# Patient Record
Sex: Female | Born: 1955 | Race: White | Hispanic: No | Marital: Married | Smoking: Never smoker
Health system: Southern US, Community
[De-identification: ages and names within clinical notes are randomized; demographics above are authoritative.]

## PROBLEM LIST (undated history)

## (undated) DIAGNOSIS — I959 Hypotension, unspecified: Secondary | ICD-10-CM

## (undated) DIAGNOSIS — J329 Chronic sinusitis, unspecified: Secondary | ICD-10-CM

## (undated) DIAGNOSIS — H9190 Unspecified hearing loss, unspecified ear: Secondary | ICD-10-CM

## (undated) DIAGNOSIS — K589 Irritable bowel syndrome without diarrhea: Secondary | ICD-10-CM

## (undated) DIAGNOSIS — C4491 Basal cell carcinoma of skin, unspecified: Secondary | ICD-10-CM

## (undated) DIAGNOSIS — L719 Rosacea, unspecified: Secondary | ICD-10-CM

## (undated) HISTORY — DX: Rosacea, unspecified: L71.9

## (undated) HISTORY — DX: Irritable bowel syndrome, unspecified: K58.9

## (undated) HISTORY — DX: Chronic sinusitis, unspecified: J32.9

## (undated) HISTORY — DX: Hypotension, unspecified: I95.9

## (undated) HISTORY — PX: BREAST EXCISIONAL BIOPSY: SUR124

## (undated) HISTORY — PX: AUGMENTATION MAMMAPLASTY: SUR837

## (undated) HISTORY — DX: Basal cell carcinoma of skin, unspecified: C44.91

## (undated) HISTORY — DX: Unspecified hearing loss, unspecified ear: H91.90

---

## 1998-03-09 ENCOUNTER — Ambulatory Visit (HOSPITAL_COMMUNITY): Admission: RE | Admit: 1998-03-09 | Discharge: 1998-03-09 | Payer: Self-pay | Admitting: Gynecology

## 1998-04-05 ENCOUNTER — Ambulatory Visit (HOSPITAL_BASED_OUTPATIENT_CLINIC_OR_DEPARTMENT_OTHER): Admission: RE | Admit: 1998-04-05 | Discharge: 1998-04-05 | Payer: Self-pay | Admitting: Surgery

## 1999-03-09 ENCOUNTER — Other Ambulatory Visit: Admission: RE | Admit: 1999-03-09 | Discharge: 1999-03-09 | Payer: Self-pay | Admitting: Gynecology

## 1999-09-26 ENCOUNTER — Encounter: Admission: RE | Admit: 1999-09-26 | Discharge: 1999-09-26 | Payer: Self-pay | Admitting: Gynecology

## 1999-09-26 ENCOUNTER — Encounter: Payer: Self-pay | Admitting: Gynecology

## 2000-05-19 ENCOUNTER — Encounter: Admission: RE | Admit: 2000-05-19 | Discharge: 2000-05-19 | Payer: Self-pay | Admitting: Gynecology

## 2000-05-19 ENCOUNTER — Encounter: Payer: Self-pay | Admitting: Gynecology

## 2000-05-21 ENCOUNTER — Other Ambulatory Visit: Admission: RE | Admit: 2000-05-21 | Discharge: 2000-05-21 | Payer: Self-pay | Admitting: Gynecology

## 2001-06-10 ENCOUNTER — Other Ambulatory Visit: Admission: RE | Admit: 2001-06-10 | Discharge: 2001-06-10 | Payer: Self-pay | Admitting: Gynecology

## 2001-06-11 ENCOUNTER — Encounter: Payer: Self-pay | Admitting: Gynecology

## 2001-06-11 ENCOUNTER — Encounter: Admission: RE | Admit: 2001-06-11 | Discharge: 2001-06-11 | Payer: Self-pay | Admitting: Gynecology

## 2002-09-28 ENCOUNTER — Encounter: Admission: RE | Admit: 2002-09-28 | Discharge: 2002-09-28 | Payer: Self-pay | Admitting: Gynecology

## 2002-09-28 ENCOUNTER — Encounter: Payer: Self-pay | Admitting: Gynecology

## 2003-06-13 ENCOUNTER — Other Ambulatory Visit: Admission: RE | Admit: 2003-06-13 | Discharge: 2003-06-13 | Payer: Self-pay | Admitting: Gynecology

## 2004-06-27 ENCOUNTER — Encounter: Admission: RE | Admit: 2004-06-27 | Discharge: 2004-06-27 | Payer: Self-pay | Admitting: Gynecology

## 2004-07-04 ENCOUNTER — Encounter: Admission: RE | Admit: 2004-07-04 | Discharge: 2004-07-04 | Payer: Self-pay | Admitting: Gynecology

## 2004-08-13 ENCOUNTER — Other Ambulatory Visit: Admission: RE | Admit: 2004-08-13 | Discharge: 2004-08-13 | Payer: Self-pay | Admitting: Gynecology

## 2005-01-04 ENCOUNTER — Encounter: Admission: RE | Admit: 2005-01-04 | Discharge: 2005-01-04 | Payer: Self-pay | Admitting: Gynecology

## 2005-10-22 ENCOUNTER — Other Ambulatory Visit: Admission: RE | Admit: 2005-10-22 | Discharge: 2005-10-22 | Payer: Self-pay | Admitting: Gynecology

## 2006-01-22 ENCOUNTER — Encounter: Admission: RE | Admit: 2006-01-22 | Discharge: 2006-01-22 | Payer: Self-pay | Admitting: Gynecology

## 2006-12-17 ENCOUNTER — Other Ambulatory Visit: Admission: RE | Admit: 2006-12-17 | Discharge: 2006-12-17 | Payer: Self-pay | Admitting: Gynecology

## 2007-05-12 ENCOUNTER — Encounter: Admission: RE | Admit: 2007-05-12 | Discharge: 2007-05-12 | Payer: Self-pay | Admitting: Gynecology

## 2008-05-13 ENCOUNTER — Encounter: Admission: RE | Admit: 2008-05-13 | Discharge: 2008-05-13 | Payer: Self-pay | Admitting: Gynecology

## 2008-05-23 ENCOUNTER — Encounter: Admission: RE | Admit: 2008-05-23 | Discharge: 2008-05-23 | Payer: Self-pay | Admitting: Gynecology

## 2009-07-03 ENCOUNTER — Encounter: Admission: RE | Admit: 2009-07-03 | Discharge: 2009-07-03 | Payer: Self-pay | Admitting: Gynecology

## 2009-07-12 ENCOUNTER — Encounter: Admission: RE | Admit: 2009-07-12 | Discharge: 2009-07-12 | Payer: Self-pay | Admitting: Gynecology

## 2010-08-26 ENCOUNTER — Encounter: Payer: Self-pay | Admitting: Gynecology

## 2011-03-27 ENCOUNTER — Other Ambulatory Visit: Payer: Self-pay | Admitting: Gynecology

## 2011-03-27 DIAGNOSIS — Z1231 Encounter for screening mammogram for malignant neoplasm of breast: Secondary | ICD-10-CM

## 2011-04-24 ENCOUNTER — Ambulatory Visit: Payer: Self-pay

## 2011-05-01 ENCOUNTER — Ambulatory Visit
Admission: RE | Admit: 2011-05-01 | Discharge: 2011-05-01 | Disposition: A | Discharge status: Home / Self Care | Payer: 59 | Source: Ambulatory Visit | Attending: Gynecology | Admitting: Gynecology

## 2011-05-01 DIAGNOSIS — Z1231 Encounter for screening mammogram for malignant neoplasm of breast: Secondary | ICD-10-CM

## 2011-07-05 ENCOUNTER — Encounter (HOSPITAL_BASED_OUTPATIENT_CLINIC_OR_DEPARTMENT_OTHER): Admission: RE | Payer: Self-pay | Source: Ambulatory Visit

## 2011-07-05 ENCOUNTER — Ambulatory Visit (HOSPITAL_BASED_OUTPATIENT_CLINIC_OR_DEPARTMENT_OTHER): Admission: RE | Admit: 2011-07-05 | Payer: 59 | Source: Ambulatory Visit | Admitting: Gynecology

## 2011-07-05 SURGERY — HYSTEROSCOPY
Anesthesia: Monitor Anesthesia Care

## 2012-05-18 ENCOUNTER — Other Ambulatory Visit: Payer: Self-pay | Admitting: Gynecology

## 2012-05-18 DIAGNOSIS — Z1231 Encounter for screening mammogram for malignant neoplasm of breast: Secondary | ICD-10-CM

## 2012-06-10 ENCOUNTER — Ambulatory Visit
Admission: RE | Admit: 2012-06-10 | Discharge: 2012-06-10 | Disposition: A | Discharge status: Home / Self Care | Payer: 59 | Source: Ambulatory Visit | Attending: Gynecology | Admitting: Gynecology

## 2012-06-10 DIAGNOSIS — Z1231 Encounter for screening mammogram for malignant neoplasm of breast: Secondary | ICD-10-CM

## 2013-12-07 ENCOUNTER — Other Ambulatory Visit: Payer: Self-pay

## 2013-12-07 DIAGNOSIS — Z1231 Encounter for screening mammogram for malignant neoplasm of breast: Secondary | ICD-10-CM

## 2013-12-15 ENCOUNTER — Ambulatory Visit
Admission: RE | Admit: 2013-12-15 | Discharge: 2013-12-15 | Disposition: A | Discharge status: Home / Self Care | Payer: 59 | Source: Ambulatory Visit

## 2013-12-15 DIAGNOSIS — Z1231 Encounter for screening mammogram for malignant neoplasm of breast: Secondary | ICD-10-CM

## 2014-07-17 ENCOUNTER — Ambulatory Visit (INDEPENDENT_AMBULATORY_CARE_PROVIDER_SITE_OTHER): Payer: 59

## 2014-08-15 ENCOUNTER — Encounter: Payer: Self-pay | Admitting: Podiatry

## 2014-08-15 ENCOUNTER — Ambulatory Visit (INDEPENDENT_AMBULATORY_CARE_PROVIDER_SITE_OTHER): Payer: 59 | Admitting: Podiatry

## 2014-08-15 VITALS — BP 138/70 | HR 61 | Resp 16

## 2014-08-15 DIAGNOSIS — L600 Ingrowing nail: Secondary | ICD-10-CM

## 2014-08-15 NOTE — Progress Notes (Signed)
   Subjective:    Patient ID: Tracie Boone, female    DOB: Nov 04, 1955, 59 y.o.   MRN: [id number]  HPI Comments: "I have a fungus"  Patient c/o thick, dark nail 1st toe left for several years. She would like the nail removed if no other treatment is available. Taken Lamisil in the past.     Review of Systems  Skin:       Change in nails  All other systems reviewed and are negative.      Objective:   Physical Exam        Assessment & Plan:

## 2014-08-15 NOTE — Patient Instructions (Addendum)

## 2014-08-17 NOTE — Progress Notes (Signed)
Subjective:     Patient ID: Tracie Boone, female   DOB: 06-10-1956, 59 y.o.   MRN: [id number]  HPI patient presents with damaged left hallux nail that's become increasingly sore and impossible for her to cut. She had the right one removed and no she needs to have this one taken care of   Review of Systems  All other systems reviewed and are negative.      Objective:   Physical Exam  Constitutional: She is oriented to person, place, and time.  Cardiovascular: Intact distal pulses.   Musculoskeletal: Normal range of motion.  Neurological: She is oriented to person, place, and time.  Skin: Skin is warm.  Nursing note and vitals reviewed.  neurovascular status intact with muscle strength adequate range of motion of the subtalar and midtarsal joint within normal limits. 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 She would like it removed permanently like the right one and I discussed procedure and risk and today I infiltrated 60 mg Xylocaine Marcaine mixture remove the hallux nail exposed the matrix and applied phenol 5 applications 30 seconds followed by alcohol lavaged and sterile dressing. Gave instructions on soaks and reappoint

## 2015-05-08 ENCOUNTER — Other Ambulatory Visit: Payer: Self-pay

## 2015-05-08 DIAGNOSIS — Z1231 Encounter for screening mammogram for malignant neoplasm of breast: Secondary | ICD-10-CM

## 2015-06-12 ENCOUNTER — Other Ambulatory Visit: Payer: Self-pay

## 2015-06-12 ENCOUNTER — Ambulatory Visit
Admission: RE | Admit: 2015-06-12 | Discharge: 2015-06-12 | Disposition: A | Discharge status: Home / Self Care | Payer: 59 | Source: Ambulatory Visit

## 2015-06-12 DIAGNOSIS — Z1231 Encounter for screening mammogram for malignant neoplasm of breast: Secondary | ICD-10-CM

## 2016-08-01 ENCOUNTER — Other Ambulatory Visit: Payer: Self-pay | Admitting: Gynecology

## 2016-08-01 DIAGNOSIS — Z1231 Encounter for screening mammogram for malignant neoplasm of breast: Secondary | ICD-10-CM

## 2016-08-27 ENCOUNTER — Ambulatory Visit
Admission: RE | Admit: 2016-08-27 | Discharge: 2016-08-27 | Disposition: A | Discharge status: Home / Self Care | Payer: 59 | Source: Ambulatory Visit | Attending: Gynecology | Admitting: Gynecology

## 2016-08-27 DIAGNOSIS — Z1231 Encounter for screening mammogram for malignant neoplasm of breast: Secondary | ICD-10-CM | POA: Diagnosis not present

## 2016-09-06 DIAGNOSIS — M25511 Pain in right shoulder: Secondary | ICD-10-CM | POA: Diagnosis not present

## 2016-10-17 DIAGNOSIS — Z01419 Encounter for gynecological examination (general) (routine) without abnormal findings: Secondary | ICD-10-CM | POA: Diagnosis not present

## 2016-10-17 DIAGNOSIS — Z780 Asymptomatic menopausal state: Secondary | ICD-10-CM | POA: Diagnosis not present

## 2016-10-18 DIAGNOSIS — Z124 Encounter for screening for malignant neoplasm of cervix: Secondary | ICD-10-CM | POA: Diagnosis not present

## 2016-12-02 DIAGNOSIS — H1013 Acute atopic conjunctivitis, bilateral: Secondary | ICD-10-CM | POA: Diagnosis not present

## 2016-12-27 DIAGNOSIS — R52 Pain, unspecified: Secondary | ICD-10-CM | POA: Diagnosis not present

## 2016-12-27 DIAGNOSIS — J40 Bronchitis, not specified as acute or chronic: Secondary | ICD-10-CM | POA: Diagnosis not present

## 2016-12-27 DIAGNOSIS — J029 Acute pharyngitis, unspecified: Secondary | ICD-10-CM | POA: Diagnosis not present

## 2017-01-06 DIAGNOSIS — J40 Bronchitis, not specified as acute or chronic: Secondary | ICD-10-CM | POA: Diagnosis not present

## 2017-01-06 DIAGNOSIS — R0602 Shortness of breath: Secondary | ICD-10-CM | POA: Diagnosis not present

## 2017-02-13 DIAGNOSIS — R05 Cough: Secondary | ICD-10-CM | POA: Diagnosis not present

## 2017-03-28 DIAGNOSIS — R05 Cough: Secondary | ICD-10-CM | POA: Diagnosis not present

## 2017-03-28 DIAGNOSIS — R55 Syncope and collapse: Secondary | ICD-10-CM | POA: Diagnosis not present

## 2017-03-28 DIAGNOSIS — Z0000 Encounter for general adult medical examination without abnormal findings: Secondary | ICD-10-CM | POA: Diagnosis not present

## 2017-03-28 DIAGNOSIS — R42 Dizziness and giddiness: Secondary | ICD-10-CM | POA: Diagnosis not present

## 2017-03-28 DIAGNOSIS — Z23 Encounter for immunization: Secondary | ICD-10-CM | POA: Diagnosis not present

## 2017-03-28 DIAGNOSIS — K589 Irritable bowel syndrome without diarrhea: Secondary | ICD-10-CM | POA: Diagnosis not present

## 2017-04-10 DIAGNOSIS — R55 Syncope and collapse: Secondary | ICD-10-CM | POA: Diagnosis not present

## 2017-04-10 DIAGNOSIS — R42 Dizziness and giddiness: Secondary | ICD-10-CM | POA: Diagnosis not present

## 2017-04-11 DIAGNOSIS — R42 Dizziness and giddiness: Secondary | ICD-10-CM | POA: Diagnosis not present

## 2017-04-13 DIAGNOSIS — R42 Dizziness and giddiness: Secondary | ICD-10-CM | POA: Diagnosis not present

## 2017-05-13 DIAGNOSIS — R42 Dizziness and giddiness: Secondary | ICD-10-CM | POA: Diagnosis not present

## 2017-05-22 DIAGNOSIS — L821 Other seborrheic keratosis: Secondary | ICD-10-CM | POA: Diagnosis not present

## 2017-05-22 DIAGNOSIS — Z419 Encounter for procedure for purposes other than remedying health state, unspecified: Secondary | ICD-10-CM | POA: Diagnosis not present

## 2017-05-22 DIAGNOSIS — L720 Epidermal cyst: Secondary | ICD-10-CM | POA: Diagnosis not present

## 2017-05-22 DIAGNOSIS — Z85828 Personal history of other malignant neoplasm of skin: Secondary | ICD-10-CM | POA: Diagnosis not present

## 2017-06-03 DIAGNOSIS — K136 Irritative hyperplasia of oral mucosa: Secondary | ICD-10-CM | POA: Diagnosis not present

## 2017-07-17 DIAGNOSIS — J329 Chronic sinusitis, unspecified: Secondary | ICD-10-CM | POA: Diagnosis not present

## 2017-09-11 ENCOUNTER — Other Ambulatory Visit: Payer: Self-pay | Admitting: Gynecology

## 2017-09-11 DIAGNOSIS — Z1231 Encounter for screening mammogram for malignant neoplasm of breast: Secondary | ICD-10-CM

## 2017-09-24 DIAGNOSIS — Z719 Counseling, unspecified: Secondary | ICD-10-CM | POA: Diagnosis not present

## 2017-10-02 ENCOUNTER — Ambulatory Visit
Admission: RE | Admit: 2017-10-02 | Discharge: 2017-10-02 | Disposition: A | Discharge status: Home / Self Care | Payer: 59 | Source: Ambulatory Visit | Attending: Gynecology | Admitting: Gynecology

## 2017-10-02 DIAGNOSIS — Z1231 Encounter for screening mammogram for malignant neoplasm of breast: Secondary | ICD-10-CM | POA: Diagnosis not present

## 2017-10-07 ENCOUNTER — Other Ambulatory Visit: Payer: Self-pay | Admitting: Obstetrics and Gynecology

## 2017-10-08 DIAGNOSIS — Z719 Counseling, unspecified: Secondary | ICD-10-CM | POA: Diagnosis not present

## 2017-10-14 DIAGNOSIS — Z719 Counseling, unspecified: Secondary | ICD-10-CM | POA: Diagnosis not present

## 2017-10-21 DIAGNOSIS — Z719 Counseling, unspecified: Secondary | ICD-10-CM | POA: Diagnosis not present

## 2017-10-28 DIAGNOSIS — Z719 Counseling, unspecified: Secondary | ICD-10-CM | POA: Diagnosis not present

## 2017-11-04 DIAGNOSIS — Z719 Counseling, unspecified: Secondary | ICD-10-CM | POA: Diagnosis not present

## 2017-11-13 DIAGNOSIS — Z719 Counseling, unspecified: Secondary | ICD-10-CM | POA: Diagnosis not present

## 2017-11-18 DIAGNOSIS — Z719 Counseling, unspecified: Secondary | ICD-10-CM | POA: Diagnosis not present

## 2018-01-28 DIAGNOSIS — K589 Irritable bowel syndrome without diarrhea: Secondary | ICD-10-CM | POA: Diagnosis not present

## 2018-03-18 DIAGNOSIS — Z780 Asymptomatic menopausal state: Secondary | ICD-10-CM | POA: Diagnosis not present

## 2018-03-18 DIAGNOSIS — Z01411 Encounter for gynecological examination (general) (routine) with abnormal findings: Secondary | ICD-10-CM | POA: Diagnosis not present

## 2018-03-18 DIAGNOSIS — N898 Other specified noninflammatory disorders of vagina: Secondary | ICD-10-CM | POA: Diagnosis not present

## 2018-04-09 DIAGNOSIS — Z1159 Encounter for screening for other viral diseases: Secondary | ICD-10-CM | POA: Diagnosis not present

## 2018-04-09 DIAGNOSIS — Z13220 Encounter for screening for lipoid disorders: Secondary | ICD-10-CM | POA: Diagnosis not present

## 2018-04-09 DIAGNOSIS — K589 Irritable bowel syndrome without diarrhea: Secondary | ICD-10-CM | POA: Diagnosis not present

## 2018-04-09 DIAGNOSIS — Z0000 Encounter for general adult medical examination without abnormal findings: Secondary | ICD-10-CM | POA: Diagnosis not present

## 2018-05-28 DIAGNOSIS — Z85828 Personal history of other malignant neoplasm of skin: Secondary | ICD-10-CM | POA: Diagnosis not present

## 2018-05-28 DIAGNOSIS — D225 Melanocytic nevi of trunk: Secondary | ICD-10-CM | POA: Diagnosis not present

## 2018-05-28 DIAGNOSIS — L570 Actinic keratosis: Secondary | ICD-10-CM | POA: Diagnosis not present

## 2018-05-28 DIAGNOSIS — L821 Other seborrheic keratosis: Secondary | ICD-10-CM | POA: Diagnosis not present

## 2018-05-29 DIAGNOSIS — Z23 Encounter for immunization: Secondary | ICD-10-CM | POA: Diagnosis not present

## 2018-09-09 ENCOUNTER — Other Ambulatory Visit: Payer: Self-pay | Admitting: Gynecology

## 2018-09-09 DIAGNOSIS — Z1231 Encounter for screening mammogram for malignant neoplasm of breast: Secondary | ICD-10-CM

## 2018-10-09 ENCOUNTER — Ambulatory Visit: Payer: 59

## 2019-02-22 ENCOUNTER — Ambulatory Visit: Payer: Self-pay

## 2019-04-09 ENCOUNTER — Other Ambulatory Visit: Payer: Self-pay

## 2019-04-09 DIAGNOSIS — Z20822 Contact with and (suspected) exposure to covid-19: Secondary | ICD-10-CM

## 2019-04-10 LAB — NOVEL CORONAVIRUS, NAA: SARS-CoV-2, NAA: NOT DETECTED

## 2019-04-19 ENCOUNTER — Other Ambulatory Visit: Payer: Self-pay

## 2019-04-19 ENCOUNTER — Ambulatory Visit
Admission: RE | Admit: 2019-04-19 | Discharge: 2019-04-19 | Disposition: A | Discharge status: Home / Self Care | Payer: 59 | Source: Ambulatory Visit | Attending: Gynecology | Admitting: Gynecology

## 2019-04-19 DIAGNOSIS — Z1231 Encounter for screening mammogram for malignant neoplasm of breast: Secondary | ICD-10-CM

## 2019-06-13 DIAGNOSIS — M79671 Pain in right foot: Secondary | ICD-10-CM | POA: Insufficient documentation

## 2019-06-26 ENCOUNTER — Other Ambulatory Visit: Payer: Self-pay

## 2019-06-26 ENCOUNTER — Ambulatory Visit: Payer: 59 | Admitting: Podiatry

## 2019-06-26 ENCOUNTER — Encounter: Payer: Self-pay | Admitting: Podiatry

## 2019-06-26 ENCOUNTER — Ambulatory Visit (INDEPENDENT_AMBULATORY_CARE_PROVIDER_SITE_OTHER): Payer: 59

## 2019-06-26 VITALS — BP 120/75 | HR 74 | Resp 16

## 2019-06-26 DIAGNOSIS — Z780 Asymptomatic menopausal state: Secondary | ICD-10-CM | POA: Insufficient documentation

## 2019-06-26 DIAGNOSIS — B001 Herpesviral vesicular dermatitis: Secondary | ICD-10-CM | POA: Insufficient documentation

## 2019-06-26 DIAGNOSIS — M779 Enthesopathy, unspecified: Secondary | ICD-10-CM

## 2019-06-26 DIAGNOSIS — M84375A Stress fracture, left foot, initial encounter for fracture: Secondary | ICD-10-CM

## 2019-06-26 DIAGNOSIS — N950 Postmenopausal bleeding: Secondary | ICD-10-CM | POA: Insufficient documentation

## 2019-06-26 DIAGNOSIS — N952 Postmenopausal atrophic vaginitis: Secondary | ICD-10-CM | POA: Insufficient documentation

## 2019-06-26 NOTE — Progress Notes (Signed)
Subjective:   Patient ID: Tracie Boone, female   DOB: 63 y.o.   MRN: [id number]   HPI Patient presents with exquisite discomfort on the top of the left foot and states that it is been going on now for about 10 days and she is gotten more active with exercising.  States it feels like she is walking on a broken bone and patient does not smoke and would like to be more active   Review of Systems  All other systems reviewed and are negative.       Objective:  Physical Exam Vitals signs and nursing note reviewed.  Constitutional:      Appearance: She is well-developed.  Pulmonary:     Effort: Pulmonary effort is normal.  Musculoskeletal: Normal range of motion.  Skin:    General: Skin is warm.  Neurological:     Mental Status: She is alert.     Neurovascular status found to be intact muscle strength adequate range of motion within normal limits.  Patient is found to have exquisite discomfort dorsal aspect left foot in the third metatarsal shaft with swelling around this area and mild discomfort surrounding this.  Patient is found to have good digital perfusion and is well oriented x3     Assessment:  Stress fracture deformity left third metatarsal with exquisite pain     Plan:  H&P reviewed condition and the probability for stress fracture.  Today I went ahead and I discussed ice therapy of an aggressive nature of immobilization and dispensed air fracture walker with all instructions on usage and the fact that I do believe I am seeing the very beginnings of a crack line in the metatarsal.  Continue boot usage for 3 more weeks and reappoint at that time  X-rays indicate there is a suspicious crack line in the shaft of the third metatarsal left that we may see get worse over the next few weeks

## 2019-07-15 ENCOUNTER — Ambulatory Visit (INDEPENDENT_AMBULATORY_CARE_PROVIDER_SITE_OTHER): Payer: 59

## 2019-07-15 ENCOUNTER — Ambulatory Visit: Payer: 59 | Admitting: Podiatry

## 2019-07-15 ENCOUNTER — Encounter: Payer: Self-pay | Admitting: Podiatry

## 2019-07-15 ENCOUNTER — Other Ambulatory Visit: Payer: Self-pay

## 2019-07-15 DIAGNOSIS — M84375A Stress fracture, left foot, initial encounter for fracture: Secondary | ICD-10-CM | POA: Diagnosis not present

## 2019-07-15 NOTE — Progress Notes (Signed)
Subjective:   Patient ID: Tracie Boone, female   DOB: 63 y.o.   MRN: [id number]   HPI Patient states that her left foot has improved quite a bit and while she still has mild discomfort is somewhat quite a bit better in that area but she states she does get pain at times around the big toe joint left   ROS      Objective:  Physical Exam  Neurovascular status intact with patient noted to have inflammation pain of a mild nature of the first MPJ left with localized fluid buildup but no indications of joint restriction motion issues with mild swelling pain around the second third distal metatarsal shaft localized with no pitting edema noted     Assessment:  Probability for low-grade stress fracture left which appears to be healing well along with possibility for inflamed first MPJ and into the inner phalangeal joint left foot     Plan:  Rex-ray foot and at this point I have recommended anti-inflammatories rigid bottom shoes and gradual reduction of the boot with hopeful complete resolution over the next 1 to 2 weeks.  Patient will be seen back as needed  X-rays indicate that there is no current breaking through the cortex of the distal metatarsal shaft second and third left with the inner phalangeal joint and the first MPJ looking healthy with no pathology

## 2019-09-27 ENCOUNTER — Telehealth: Payer: Self-pay | Admitting: *Deleted

## 2019-09-27 NOTE — Telephone Encounter (Signed)
Pt states she has been in a boot for stress fractures since 06/26/2019 and she does not think she is getting any better and would like to schedule for a MRI.

## 2019-09-27 NOTE — Telephone Encounter (Signed)
I reviewed LOV 12//05/2019 and Dr. Paulla Dolly stated the reX-ray did not show a break in the left 2nd and 3rd metatarsals, and if she were still having a problem she may have something else going on or a new "injury" but there needed to be documentation of the reevaluation and x-rays. Pt states understanding and I transferred to schedulers.

## 2019-09-28 NOTE — Telephone Encounter (Signed)
Called patient and lvm for her to call the office and schedule appt

## 2019-10-08 ENCOUNTER — Encounter: Payer: Self-pay | Admitting: Podiatry

## 2019-10-08 ENCOUNTER — Other Ambulatory Visit: Payer: Self-pay

## 2019-10-08 ENCOUNTER — Ambulatory Visit (INDEPENDENT_AMBULATORY_CARE_PROVIDER_SITE_OTHER): Payer: 59

## 2019-10-08 ENCOUNTER — Ambulatory Visit: Payer: 59 | Admitting: Podiatry

## 2019-10-08 DIAGNOSIS — M84375A Stress fracture, left foot, initial encounter for fracture: Secondary | ICD-10-CM | POA: Diagnosis not present

## 2019-10-11 NOTE — Progress Notes (Signed)
Subjective:   Patient ID: Tracie Boone, female   DOB: 64 y.o.   MRN: [id number]   HPI Patient states I have had some left foot pain recently and I had had a fracture previously and it started to hurt be quite a bit again   ROS      Objective:  Physical Exam  Neurovascular status intact with patient found to have discomfort in the proximal metatarsal shaft to left that is painful when pressed and moderate edema surrounding the area     Assessment:  Possibility for fracture of the base of the second metatarsal left which appears to be new at the current time     Plan:  Reviewed condition and recommended immobilization and did dispense surgical shoe and discussed fracture of the base of the second metatarsal.  This is a new fracture based on previous x-rays  X-rays indicate that there is a fracture of the base of the second metatarsal left most likely that is localized

## 2019-11-04 ENCOUNTER — Encounter: Payer: Self-pay | Admitting: Podiatry

## 2019-11-04 ENCOUNTER — Ambulatory Visit (INDEPENDENT_AMBULATORY_CARE_PROVIDER_SITE_OTHER): Payer: 59

## 2019-11-04 ENCOUNTER — Other Ambulatory Visit: Payer: Self-pay

## 2019-11-04 ENCOUNTER — Ambulatory Visit: Payer: 59 | Admitting: Podiatry

## 2019-11-04 VITALS — Temp 97.7°F

## 2019-11-04 DIAGNOSIS — M84375A Stress fracture, left foot, initial encounter for fracture: Secondary | ICD-10-CM

## 2019-11-04 NOTE — Progress Notes (Signed)
Subjective:   Patient ID: Tracie Boone, female   DOB: 64 y.o.   MRN: [id number]   HPI Patient presents stating doing a lot better today and has less swelling and I stopped wearing the surgical shoe recently and I have not yet picked up activity   ROS      Objective:  Physical Exam  Neurovascular status intact with patient's left foot doing very well with mild dorsal edema but improved from previous     Assessment:  Fracture left dorsal foot improving     Plan:  H&P reviewed condition and at this point I recommended continuation of ice as needed gradual increase in activity and shoe gear that is rigid to take pressure off the foot.  Educated her on the possibility for adjacent fractures and what symptoms would look like and what to do if we see it  X-rays indicate that there has a fracture of the base of the second metatarsal left that appears to be healing uneventfully

## 2020-07-07 ENCOUNTER — Ambulatory Visit: Payer: 59 | Attending: Internal Medicine

## 2020-07-07 DIAGNOSIS — Z23 Encounter for immunization: Secondary | ICD-10-CM

## 2020-07-07 NOTE — Progress Notes (Signed)
   Covid-19 Vaccination Clinic  Name:  Tracie Boone    MRN: [id number] DOB: [birth year]  07/07/2020  [name] was observed post Covid-19 immunization for 15 minutes without incident. She was provided with Vaccine Information Sheet and instruction to access the V-Safe system.   [name] was instructed to call 911 with any severe reactions post vaccine: Marland Kitchen Difficulty breathing  . Swelling of face and throat  . A fast heartbeat  . A bad rash all over body  . Dizziness and weakness   Immunizations Administered    Name Date Dose VIS Date Route   Pfizer COVID-19 Vaccine 07/07/2020  1:34 PM 0.3 mL 05/24/2020 Intramuscular   Manufacturer: [place]   Lot: [id number]   [place]: [id number]

## 2020-07-24 ENCOUNTER — Other Ambulatory Visit: Payer: 59

## 2020-07-24 DIAGNOSIS — Z20822 Contact with and (suspected) exposure to covid-19: Secondary | ICD-10-CM

## 2020-07-26 LAB — NOVEL CORONAVIRUS, NAA: SARS-CoV-2, NAA: DETECTED — AB

## 2020-07-26 LAB — SARS-COV-2, NAA 2 DAY TAT

## 2020-10-10 ENCOUNTER — Other Ambulatory Visit: Payer: Self-pay | Admitting: Gynecology

## 2020-10-10 DIAGNOSIS — Z1231 Encounter for screening mammogram for malignant neoplasm of breast: Secondary | ICD-10-CM

## 2020-12-04 ENCOUNTER — Other Ambulatory Visit: Payer: Self-pay

## 2020-12-04 ENCOUNTER — Ambulatory Visit
Admission: RE | Admit: 2020-12-04 | Discharge: 2020-12-04 | Disposition: A | Discharge status: Home / Self Care | Payer: 59 | Source: Ambulatory Visit | Attending: Gynecology | Admitting: Gynecology

## 2020-12-04 DIAGNOSIS — Z1231 Encounter for screening mammogram for malignant neoplasm of breast: Secondary | ICD-10-CM

## 2021-04-01 IMAGING — MG MM  DIGITAL SCREENING BREAST BILAT IMPLANT W/ TOMO W/ CAD
8 of 12 series · 8 of 28 positions shown · non-contrast
Comparison: Previous exam(s).

CLINICAL DATA: Screening.

EXAM:
DIGITAL SCREENING BILATERAL MAMMOGRAM WITH IMPLANTS, CAD AND TOMO
The patient has retroglandular implants. Standard and implant
displaced views were performed.

[L CC]
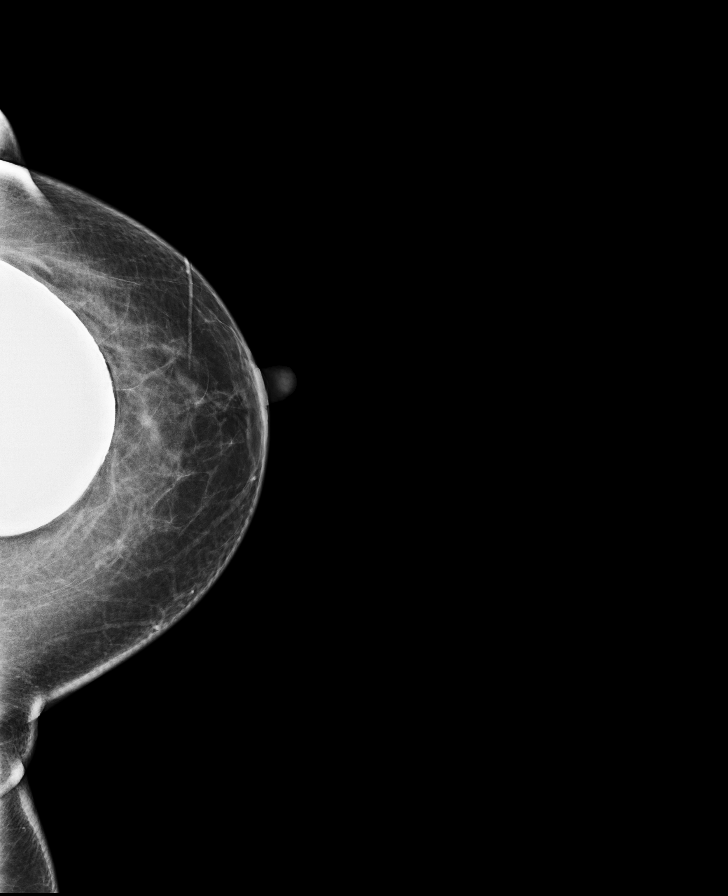

[R MLO]
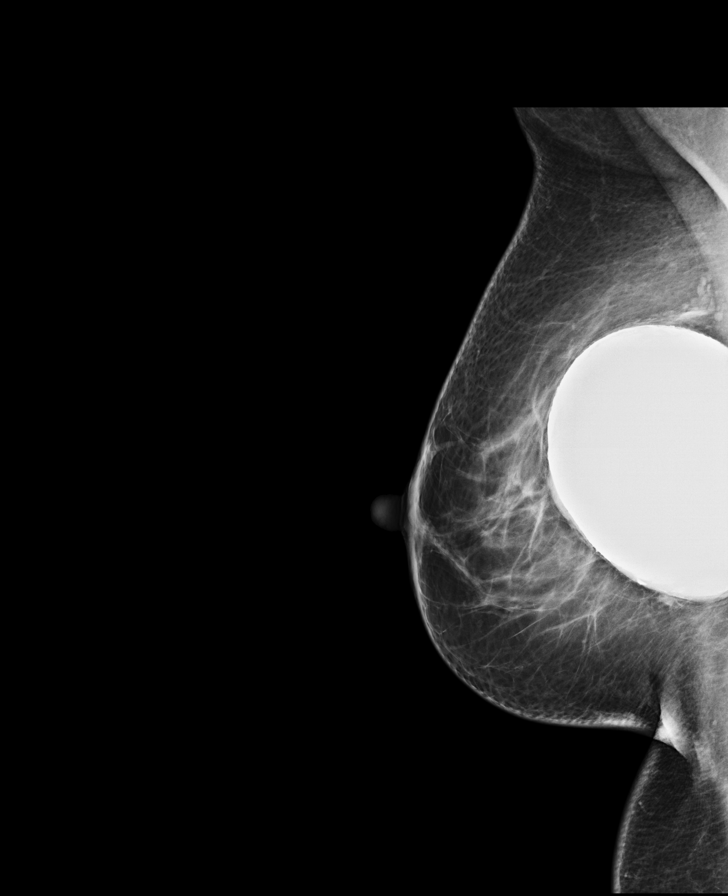

[R CC]
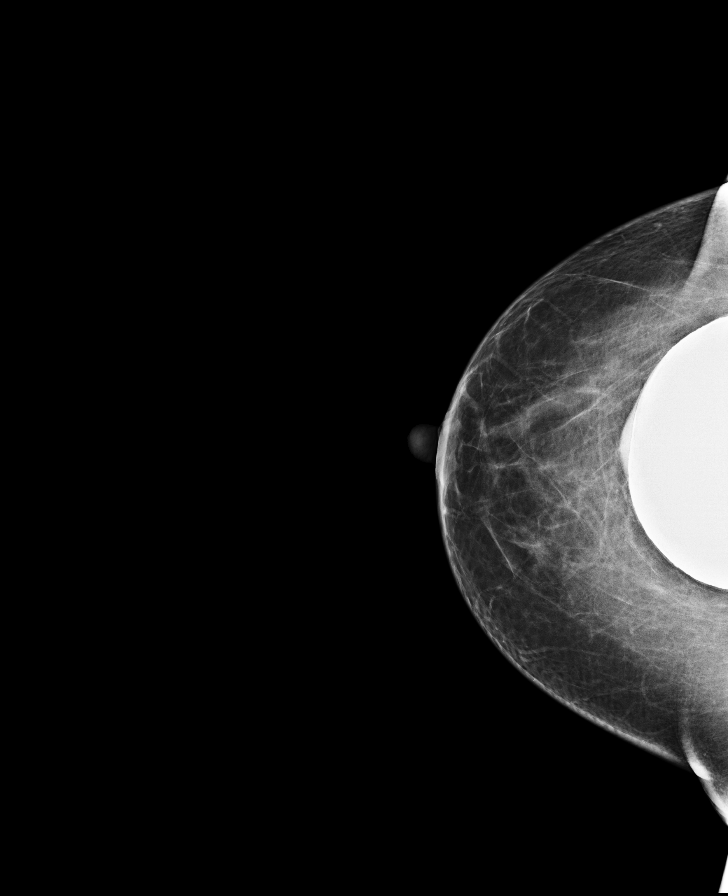

[L MLO]
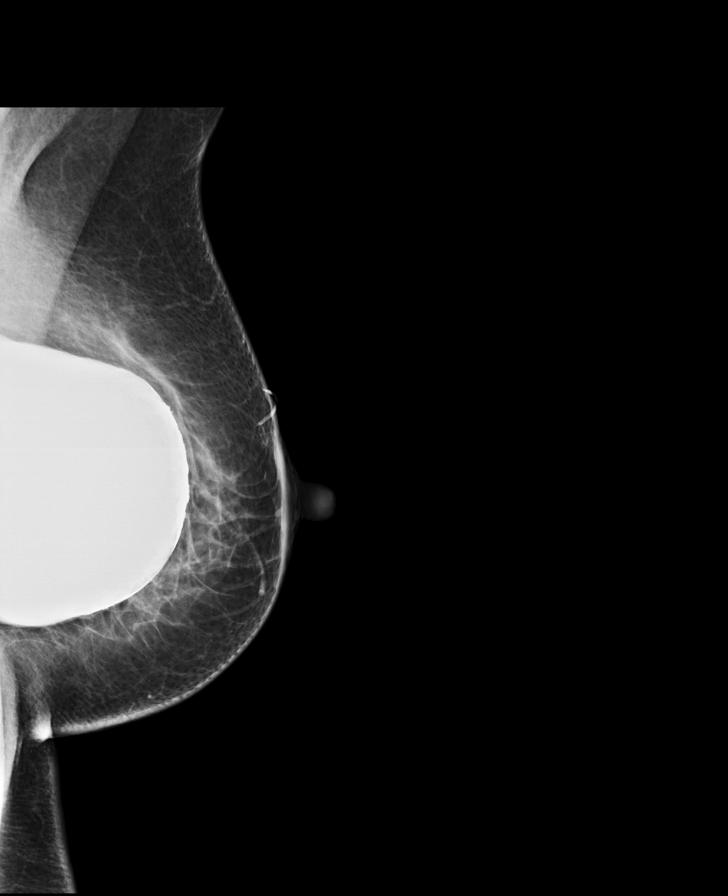

[R CC synth-2D]
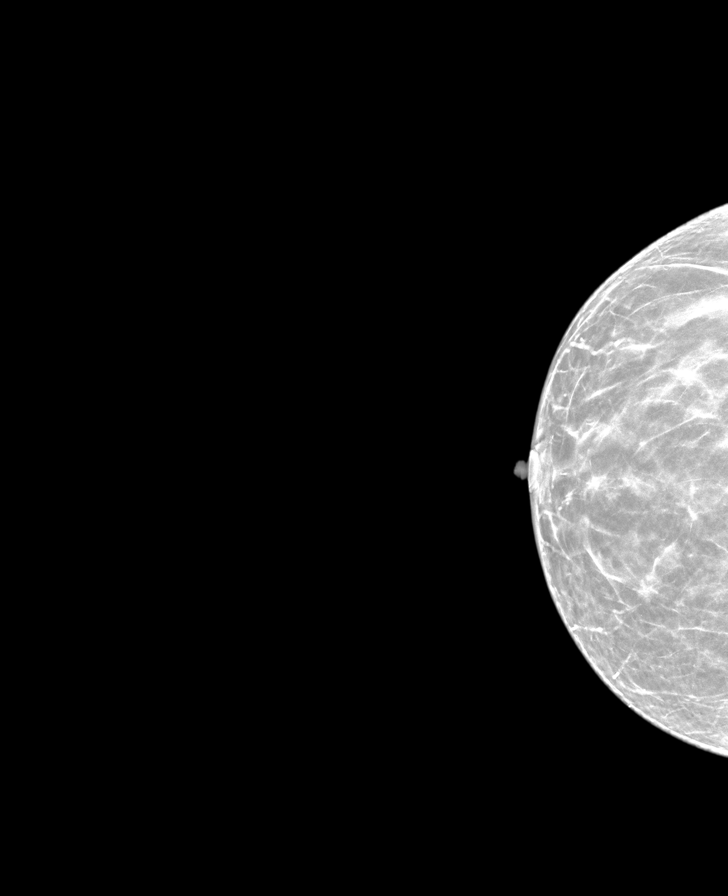

[R MLO synth-2D]
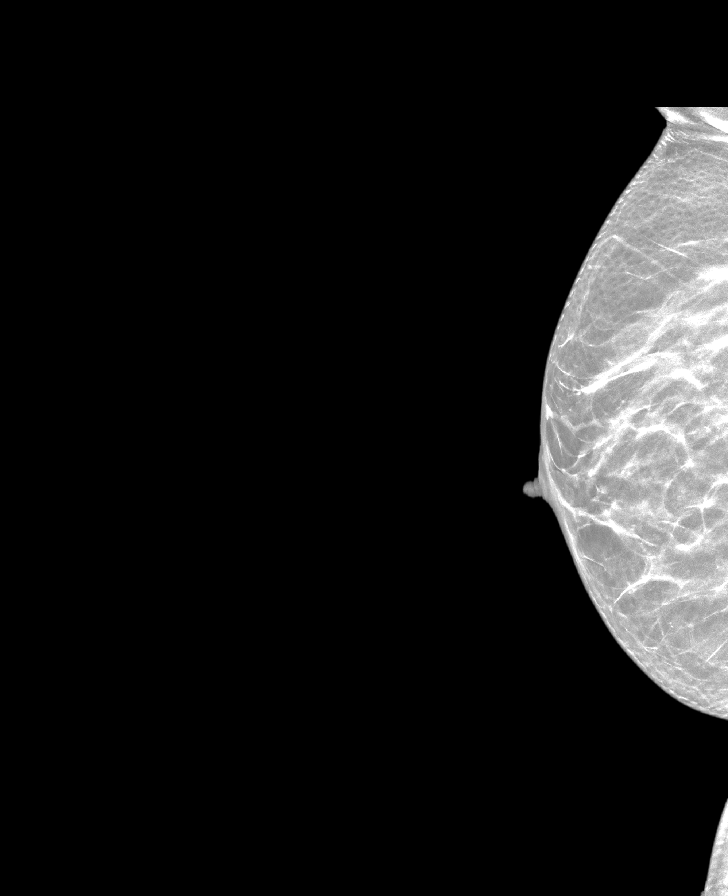

[L MLO synth-2D]
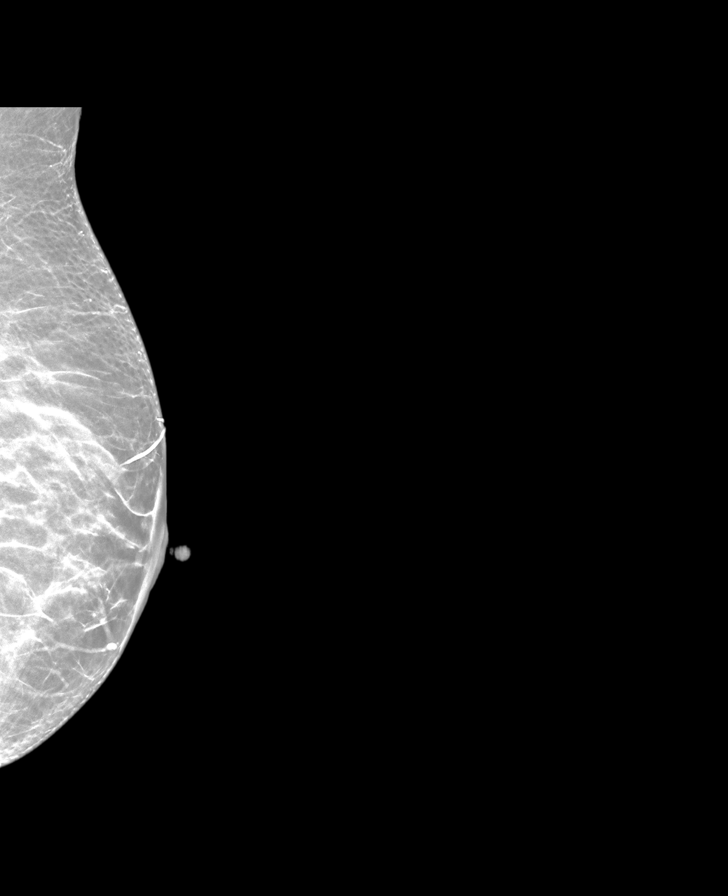

[L CC synth-2D]
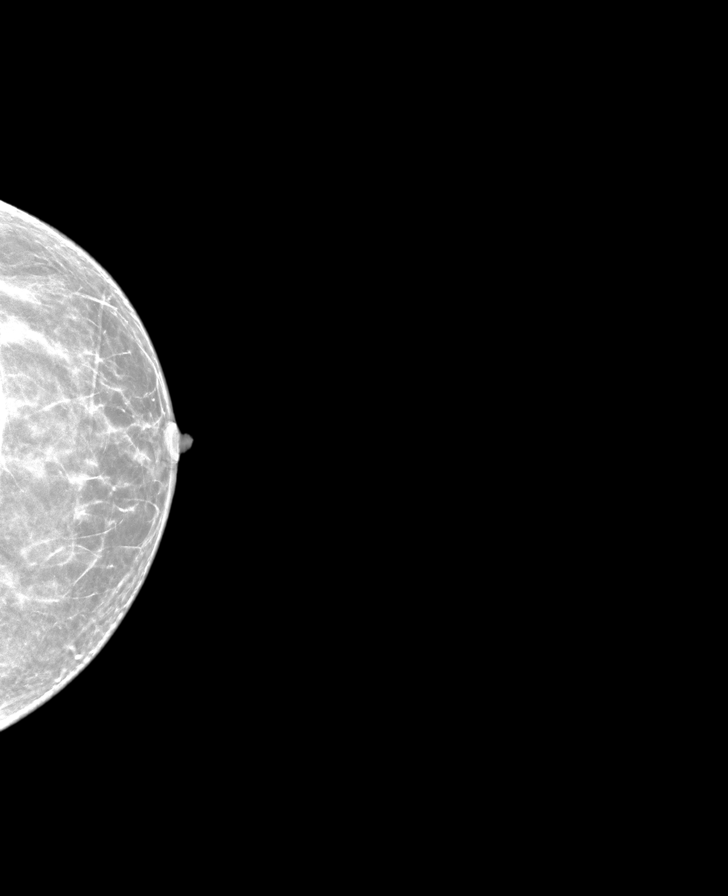

[8 of 28 positions shown; findings below may reference images not displayed]

ACR Breast Density Category b: There are scattered areas of
fibroglandular density.
FINDINGS: There are no findings suspicious for malignancy. Images were
processed with CAD.
IMPRESSION: No mammographic evidence of malignancy. A result letter of this
screening mammogram will be mailed directly to the patient.

RECOMMENDATION:
Screening mammogram in one year. (Code:VZ-A-PDF)

BI-RADS CATEGORY  1:  Negative.

## 2022-01-14 ENCOUNTER — Other Ambulatory Visit: Payer: Self-pay | Admitting: Gynecology

## 2022-01-14 DIAGNOSIS — Z1231 Encounter for screening mammogram for malignant neoplasm of breast: Secondary | ICD-10-CM

## 2022-01-29 ENCOUNTER — Ambulatory Visit
Admission: RE | Admit: 2022-01-29 | Discharge: 2022-01-29 | Disposition: A | Discharge status: Home / Self Care | Payer: 59 | Source: Ambulatory Visit | Attending: Gynecology | Admitting: Gynecology

## 2022-01-29 ENCOUNTER — Other Ambulatory Visit: Payer: Self-pay | Admitting: Gynecology

## 2022-01-29 DIAGNOSIS — Z1231 Encounter for screening mammogram for malignant neoplasm of breast: Secondary | ICD-10-CM

## 2022-09-28 NOTE — Progress Notes (Unsigned)
No chief complaint on file.  History of Present Illness: 67 yo female with history of irritable bowel syndrome who is here today as a new consult, referred by Dr. ***, for the evaluation of ***  Primary Care Physician: Marda Stalker, PA-C   Past Medical History:  Diagnosis Date   Basal cell carcinoma    Hearing disorder    Hypotensive episode    IBS (irritable bowel syndrome)    Rosacea    Sinusitis     Past Surgical History:  Procedure Laterality Date   AUGMENTATION MAMMAPLASTY Bilateral    BREAST EXCISIONAL BIOPSY Left     Current Outpatient Medications  Medication Sig Dispense Refill   Dermatological Products, Misc. (ELETONE) CREA Apply topically.     mupirocin ointment (BACTROBAN) 2 % mupirocin 2 % topical ointment     valACYclovir (VALTREX) 1000 MG tablet Take 1,000 mg by mouth 2 (two) times daily.     No current facility-administered medications for this visit.    Allergies  Allergen Reactions   Doxycycline     Social History   Socioeconomic History   Marital status: Married    Spouse name: Not on file   Number of children: Not on file   Years of education: Not on file   Highest education level: Not on file  Occupational History   Not on file  Tobacco Use   Smoking status: Never   Smokeless tobacco: Never  Substance and Sexual Activity   Alcohol use: Yes    Alcohol/week: 0.0 standard drinks of alcohol   Drug use: Not on file   Sexual activity: Not on file  Other Topics Concern   Not on file  Social History Narrative   Not on file   Social Determinants of Health   Financial Resource Strain: Not on file  Food Insecurity: Not on file  Transportation Needs: Not on file  Physical Activity: Not on file  Stress: Not on file  Social Connections: Not on file  Intimate Partner Violence: Not on file    Family History  Problem Relation Age of Onset   Breast cancer Mother 34   Breast cancer Maternal Aunt 35    Review of Systems:  As  stated in the HPI and otherwise negative.   There were no vitals taken for this visit.  Physical Examination: General: Well developed, well nourished, NAD  HEENT: OP clear, mucus membranes moist  SKIN: warm, dry. No rashes. Neuro: No focal deficits  Musculoskeletal: Muscle strength 5/5 all ext  Psychiatric: Mood and affect normal  Neck: No JVD, no carotid bruits, no thyromegaly, no lymphadenopathy.  Lungs:Clear bilaterally, no wheezes, rhonci, crackles Cardiovascular: Regular rate and rhythm. No murmurs, gallops or rubs. Abdomen:Soft. Bowel sounds present. Non-tender.  Extremities: No lower extremity edema. Pulses are 2 + in the bilateral DP/PT.  EKG:  EKG {ACTION; IS/IS [id number] ordered today. The ekg ordered today demonstrates ***  Recent Labs: No results found for requested labs within last 365 days.   Lipid Panel No results found for: "CHOL", "TRIG", "HDL", "CHOLHDL", "VLDL", "LDLCALC", "LDLDIRECT"   Wt Readings from Last 3 Encounters:  No data found for Wt      Assessment and Plan:   1.   Labs/ tests ordered today include:  No orders of the defined types were placed in this encounter.    Disposition:   F/U with me in ***    Signed, Lauree Chandler, MD, [facility] 09/28/2022 8:32 AM    [facility] [place], [place], [place]  [address] Phone: [phone]; Fax: [phone]

## 2022-09-30 ENCOUNTER — Ambulatory Visit: Payer: Medicare Other | Attending: Cardiovascular Disease | Admitting: Cardiovascular Disease

## 2022-09-30 ENCOUNTER — Encounter: Payer: Self-pay | Admitting: Cardiovascular Disease

## 2022-09-30 VITALS — BP 142/86 | HR 65 | Ht 67.0 in | Wt 177.2 lb

## 2022-09-30 DIAGNOSIS — I950 Idiopathic hypotension: Secondary | ICD-10-CM

## 2022-09-30 NOTE — Patient Instructions (Addendum)
Medication Instructions:  No changes *If you need a refill on your cardiac medications before your next appointment, please call your pharmacy*   Lab Work: None today If you have labs (blood work) drawn today and your tests are completely normal, you will receive your results only by: [place] (if you have MyChart) OR A paper copy in the mail If you have any lab test that is abnormal or we need to change your treatment, we will call you to review the results.   Testing/Procedures: Your physician has requested that you have an echocardiogram. Echocardiography is a painless test that uses sound waves to create images of your heart. It provides your doctor with information about the size and shape of your heart and how well your heart's chambers and valves are working. This procedure takes approximately one hour. There are no restrictions for this procedure. Please do NOT wear cologne, perfume, aftershave, or lotions (deodorant is allowed). Please arrive 15 minutes prior to your appointment time.    Follow-Up: At [facility], you and your health needs are our priority.  As part of our continuing mission to provide you with exceptional heart care, we have created designated Provider Care Teams.  These Care Teams include your primary Cardiologist (physician) and Advanced Practice Providers (APPs -  Physician Assistants and Nurse Practitioners) who all work together to provide you with the care you need, when you need it.   Your next appointment:   4-6 week(s)  Provider:   Lauree Chandler, MD

## 2022-10-29 ENCOUNTER — Ambulatory Visit (HOSPITAL_COMMUNITY): Payer: Medicare Other | Attending: Cardiology

## 2022-10-29 DIAGNOSIS — I950 Idiopathic hypotension: Secondary | ICD-10-CM | POA: Insufficient documentation

## 2022-10-29 DIAGNOSIS — I9589 Other hypotension: Secondary | ICD-10-CM | POA: Diagnosis not present

## 2022-10-29 DIAGNOSIS — R42 Dizziness and giddiness: Secondary | ICD-10-CM | POA: Insufficient documentation

## 2022-10-29 DIAGNOSIS — R002 Palpitations: Secondary | ICD-10-CM | POA: Diagnosis not present

## 2022-10-30 LAB — ECHOCARDIOGRAM COMPLETE
Area-P 1/2: 3.75 cm2
S' Lateral: 3.2 cm

## 2022-11-14 ENCOUNTER — Encounter: Payer: Self-pay | Admitting: Cardiovascular Disease

## 2022-11-14 ENCOUNTER — Ambulatory Visit: Payer: Medicare Other | Attending: Cardiovascular Disease | Admitting: Cardiovascular Disease

## 2022-11-14 VITALS — BP 113/80 | HR 67 | Ht 67.0 in | Wt 177.0 lb

## 2022-11-14 DIAGNOSIS — I950 Idiopathic hypotension: Secondary | ICD-10-CM | POA: Diagnosis not present

## 2022-11-14 NOTE — Patient Instructions (Signed)
Medication Instructions:  No changes *If you need a refill on your cardiac medications before your next appointment, please call your pharmacy*   Lab Work: none If you have labs (blood work) drawn today and your tests are completely normal, you will receive your results only by: MyChart Message (if you have MyChart) OR A paper copy in the mail If you have any lab test that is abnormal or we need to change your treatment, we will call you to review the results.   Testing/Procedures: none   Follow-Up: As needed 

## 2022-11-14 NOTE — Progress Notes (Signed)
Chief Complaint  Patient presents with   Follow-up    Dizziness    History of Present Illness: 67 yo female with history of irritable bowel syndrome and hypotension who is here today for follow up. I saw her as a new patient 09/30/22 for the evaluation of hypotension and dizziness. She was seen remotely by Dr. Donnie Aho for the same complaints. She feels well overall. She was recently seen in primary care and reported having some dizzy spells. BP was 96/52 on 09/11/22. Her BP here at her first office visit was 142/86. She has had dizzy spells for years. No syncope. No chest pain or dyspnea. Echo 10/29/22 with LVEF=60-65%. No valve disease.   She is here today for follow up. The patient denies any chest pain, dyspnea, palpitations, lower extremity edema, orthopnea, PND, near syncope or syncope. Rare dizziness but overall feeling well.    Primary Care Physician: Jarrett Soho, PA-C   Past Medical History:  Diagnosis Date   Basal cell carcinoma    Hearing disorder    Hypotensive episode    IBS (irritable bowel syndrome)    Rosacea    Sinusitis     Past Surgical History:  Procedure Laterality Date   AUGMENTATION MAMMAPLASTY Bilateral    BREAST EXCISIONAL BIOPSY Left     Current Outpatient Medications  Medication Sig Dispense Refill   Dermatological Products, Misc. (ELETONE) CREA Apply topically.     valACYclovir (VALTREX) 1000 MG tablet Take 1,000 mg by mouth 2 (two) times daily.     No current facility-administered medications for this visit.    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  Tobacco Use   Smoking status: Never   Smokeless tobacco: Never  Substance and Sexual Activity    Alcohol use: Yes    Comment: One glass of wine perday   Drug use: Not on file   Sexual activity: Not on file  Other Topics Concern   Not on file  Social History Narrative   Not on file   Social Determinants of Health   Financial Resource Strain: Not on file  Food Insecurity: Not on file  Transportation Needs: Not on file  Physical Activity: Not on file  Stress: Not on file  Social Connections: Not on file  Intimate Partner Violence: Not on file    Family History  Problem Relation Age of Onset   Breast cancer Mother 64   AVM Sister    Breast cancer Maternal Aunt 35    Review of Systems:  As stated in the HPI and otherwise negative.   BP 113/80   Pulse 67   Ht 5\' 7"  (1.702 m)   Wt 80.3 kg   SpO2 98%   BMI 27.72 kg/m   Physical Examination: General: Well developed, well nourished, NAD  HEENT: OP clear, mucus membranes moist  SKIN: warm, dry. No rashes. Neuro: No focal deficits  Musculoskeletal: Muscle strength 5/5 all ext  Psychiatric: Mood and affect normal  Neck: No JVD, no carotid bruits, no thyromegaly, no lymphadenopathy.  Lungs:Clear bilaterally, no wheezes, rhonci, crackles Cardiovascular: Regular rate and rhythm. No murmurs, gallops or rubs. Abdomen:Soft. Bowel sounds present. Non-tender.  Extremities: No lower extremity edema. Pulses are 2 + in the bilateral DP/PT.  EKG:  EKG is not ordered today The ekg ordered today demonstrates   Echo 10/29/22: 1. Left ventricular ejection fraction, by estimation, is 60 to 65%. The  left ventricle has normal function. The left ventricle has no regional  wall motion abnormalities. Left ventricular diastolic parameters were  normal.   2. Right ventricular systolic function is normal. The right ventricular  size is normal.   3. The mitral valve is normal in structure. Trivial mitral valve  regurgitation. No evidence of mitral stenosis.   4. The aortic valve is normal in structure. Aortic valve regurgitation is  not  visualized. No aortic stenosis is present.   5. The inferior vena cava is normal in size with greater than 50%  respiratory variability, suggesting right atrial pressure of 3 mmHg.   Recent Labs: No results found for requested labs within last 365 days.   Lipid Panel No results found for: "CHOL", "TRIG", "HDL", "CHOLHDL", "VLDL", "LDLCALC", "LDLDIRECT"   Wt Readings from Last 3 Encounters:  11/14/22 80.3 kg  09/30/22 80.4 kg    Assessment and Plan:   1. Hypotension/Dizziness: Normal echo. She continues to have mild dizziness at times but no near syncope or syncope. I have encouraged her to aggressively push her hydration and add salt to her food. She can wear compression stockings. Nothing else to add at this time.   Labs/ tests ordered today include:  No orders of the defined types were placed in this encounter.  Disposition:   F/U with me as needed  Signed, Verne Carrow, MD, [facility] 11/14/2022 8:53 AM    [facility] [address] [place], [place], [place]  [address] Phone: [phone]; Fax: [phone]

## 2023-02-26 ENCOUNTER — Telehealth: Payer: Self-pay | Admitting: Cardiovascular Disease

## 2023-02-26 DIAGNOSIS — R6889 Other general symptoms and signs: Secondary | ICD-10-CM

## 2023-02-26 NOTE — Telephone Encounter (Signed)
Aware MD & RN are not  in  the office today to address. Informed forwarding to them and someone will follow up once advised on by MD. Patient verbalized understanding and agreeable to plan.

## 2023-02-26 NOTE — Telephone Encounter (Signed)
Received a message via pt schedule of the patient requesting an appointment with Dr. Clifton James stating:  "I recently had a home visit thru my Medicare provider that included a circulation test. The Quantaflow test showed my left side at 0.23 which seems alarming. My right side was .73."  When asked if pt has had any swelling in her legs due to this message she stated: "Not recently. I have had swelling during long flights in the past year."  Please advise.

## 2023-02-27 NOTE — Telephone Encounter (Signed)
Tracie Hazel, MD  You; Baird Lyons, RNYesterday (12:59 PM)   I have evaluated her for dizziness in the past. If she had a lower extremity test that was this abnormal (which seems unlikely in someone with no leg pain), then we need to set her up for LE arterial dopplers and ABI. Thanks, chris  ______________________________________________________________  [facility] provider did a Quantaflow test - attached to toes, final measurement low compared to normals: 1.0 - 1.4.  She has no leg pain, no heaviness, sometimes tingling.  Walks 2-3 miles per day without difficulty.  Orders placed for ABI and lower extremity arterial dopplers.  Pt aware she will be called to schedule and is appreciative for assistance.

## 2023-03-04 ENCOUNTER — Ambulatory Visit (HOSPITAL_COMMUNITY)
Admission: RE | Admit: 2023-03-04 | Discharge: 2023-03-04 | Disposition: A | Discharge status: Home / Self Care | Payer: Medicare Other | Source: Ambulatory Visit | Attending: Cardiovascular Disease | Admitting: Cardiovascular Disease

## 2023-03-04 DIAGNOSIS — R6889 Other general symptoms and signs: Secondary | ICD-10-CM | POA: Diagnosis present

## 2023-03-04 LAB — VAS US ABI WITH/WO TBI
Left ABI: 1.09
Right ABI: 1.14

## 2023-05-02 ENCOUNTER — Other Ambulatory Visit: Payer: Self-pay | Admitting: Gynecology

## 2023-05-02 DIAGNOSIS — Z1231 Encounter for screening mammogram for malignant neoplasm of breast: Secondary | ICD-10-CM

## 2023-06-17 ENCOUNTER — Ambulatory Visit: Payer: Medicare Other

## 2023-06-27 ENCOUNTER — Ambulatory Visit
Admission: RE | Admit: 2023-06-27 | Discharge: 2023-06-27 | Disposition: A | Discharge status: Home / Self Care | Payer: Medicare Other | Source: Ambulatory Visit | Attending: Gynecology | Admitting: Gynecology

## 2023-06-27 DIAGNOSIS — Z1231 Encounter for screening mammogram for malignant neoplasm of breast: Secondary | ICD-10-CM

## 2023-08-20 ENCOUNTER — Other Ambulatory Visit: Payer: Self-pay | Admitting: Orthopedic Surgery

## 2023-08-20 DIAGNOSIS — M4156 Other secondary scoliosis, lumbar region: Secondary | ICD-10-CM

## 2023-08-20 DIAGNOSIS — M810 Age-related osteoporosis without current pathological fracture: Secondary | ICD-10-CM

## 2023-09-03 ENCOUNTER — Ambulatory Visit
Admission: RE | Admit: 2023-09-03 | Discharge: 2023-09-03 | Disposition: A | Discharge status: Home / Self Care | Payer: Medicare Other | Source: Ambulatory Visit | Attending: Orthopedic Surgery | Admitting: Orthopedic Surgery

## 2023-09-03 DIAGNOSIS — M4156 Other secondary scoliosis, lumbar region: Secondary | ICD-10-CM

## 2023-09-03 DIAGNOSIS — M810 Age-related osteoporosis without current pathological fracture: Secondary | ICD-10-CM

## 2023-10-29 ENCOUNTER — Ambulatory Visit

## 2023-10-29 ENCOUNTER — Other Ambulatory Visit: Payer: Self-pay

## 2023-10-29 DIAGNOSIS — R293 Abnormal posture: Secondary | ICD-10-CM | POA: Insufficient documentation

## 2023-10-29 DIAGNOSIS — R262 Difficulty in walking, not elsewhere classified: Secondary | ICD-10-CM | POA: Insufficient documentation

## 2023-10-29 DIAGNOSIS — M5459 Other low back pain: Secondary | ICD-10-CM | POA: Insufficient documentation

## 2023-10-29 DIAGNOSIS — R252 Cramp and spasm: Secondary | ICD-10-CM | POA: Diagnosis present

## 2023-10-29 DIAGNOSIS — M6281 Muscle weakness (generalized): Secondary | ICD-10-CM | POA: Insufficient documentation

## 2023-10-29 NOTE — Therapy (Signed)
 OUTPATIENT PHYSICAL THERAPY THORACOLUMBAR EVALUATION   Patient Name: Tracie Boone MRN: [id number] DOB:[birth year], 68 y.o., female Today's Date: 10/29/2023  END OF SESSION:  PT End of Session - 10/29/23 1104     Visit Number 1    Date for PT Re-Evaluation 12/24/23    Authorization Type [employer] Medicare-Auth requested    Progress Note Due on Visit 10    PT Start Time 1021    PT Stop Time 1102    PT Time Calculation (min) 41 min    Activity Tolerance Patient tolerated treatment well    Behavior During Therapy WFL for tasks assessed/performed             Past Medical History:  Diagnosis Date   Basal cell carcinoma    Hearing disorder    Hypotensive episode    IBS (irritable bowel syndrome)    Rosacea    Sinusitis    Past Surgical History:  Procedure Laterality Date   AUGMENTATION MAMMAPLASTY Bilateral    BREAST EXCISIONAL BIOPSY Left    Patient Active Problem List   Diagnosis Date Noted   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   Herpes labialis 06/26/2019   Menopause present 06/26/2019   Postmenopausal bleeding 06/26/2019   Right foot pain 06/13/2019    PCP: Jarrett Soho, PA-C   REFERRING PROVIDER: Audrie Gallus, PA  REFERRING DIAG:  Free Text Diagnosis  Intervertebral disc disorders w/ radiculopathy, lumbar region    Rationale for Evaluation on and Treatment: Rehabilitation  THERAPY DIAG:  Other low back pain  Abnormal posture  Cramp and spasm  ONSET DATE: chronic with flare up   SUBJECTIVE:                                                                                                                                                                                           SUBJECTIVE STATEMENT: Pt presents to PT with LBP and scoliosis of a chronic nature.  Pt has osteoporosis with -3.1 T-score.  She has had PT in the past that was helpful.  She has been guarded with her movement after being informed that she has osteoporosis.  She walks a few days a  week.   From MD note on 10/21/23:  Tracie Boone is a pleasant, 68 y.o. female who presents as referral from Dr. Malachy Chamber for evaluation and treatment of osteoporosis. Tracie Boone has been having low back pain with radiculopathy and there is consideration for surgery. However, she is not a prime candidate because she has a T-score -3.1 in her lumbar spine from a recent DEXA scan. She notes history of a right foot stress fracture from  no trauma.   PERTINENT HISTORY:  Basal cell carcinoma, osteoporosis (-3.1 T-score)  PAIN: 10/29/23 Are you having pain? Yes: NPRS scale: 0-7/10 Pain location: bil low back  Pain description: aching Aggravating factors: standing (15-20 min), sitting Relieving factors: Tylenol  PRECAUTIONS: None  RED FLAGS: None   WEIGHT BEARING RESTRICTIONS: No  FALLS:  Has patient fallen in last 6 months? No  LIVING ENVIRONMENT: Lives with: lives with their spouse Lives in: House/apartment   OCCUPATION: retired   PLOF: Independent, Vocation/Vocational requirements: retired, and Leisure: walks 2-3 miles 4-5x/wk   PATIENT GOALS: reduce risk of fracture, reduce LBP  NEXT MD VISIT: April   OBJECTIVE:  Note: Objective measures were completed at Evaluation unless otherwise noted.  DIAGNOSTIC FINDINGS:  See above for t-score information  PATIENT SURVEYS:  10/29/23: Modified Oswestry 20/50=40%   COGNITION: Overall cognitive status: Within functional limits for tasks assessed     SENSATION: WFL  MUSCLE LENGTH: Hamstrings: limited by 25% bil    POSTURE: rounded shoulders, forward head, and flexed trunk   PALPATION: Palpable tenderness over bil lumbar paraspinals and quadratus.  Limited PA mobility with pain T10-L45  LUMBAR ROM:  Sidebending is full, rotation and flexion not tested due to t-score   LOWER EXTREMITY ROM:     Hip flexibility limited by 25% bil without pain   LOWER EXTREMITY MMT:    4+/5 bil LE and UE strengt      GAIT: Distance walked: 50 Assistive device utilized: None Level of assistance: Complete Independence Comments: flexed trunk and guarded movement   TREATMENT DATE:  10/29/23:  Findings from evaluation discussed, pt educated on plan of care, HEP initiated.  Do/Don't of osteoporosis to avoid fracture, importance of weight bearing activity and weight training                                                                                                                               PATIENT EDUCATION:  Education details: Access Code: [email or web address] Person educated: Patient Education method: Explanation, Demonstration, and Handouts Education comprehension: verbalized understanding and returned demonstration  HOME EXERCISE PROGRAM: Access Code: [email or web address] URL: [email or web address] Date: 10/29/2023 Prepared by: Tresa Endo  Exercises - Seated Scapular Retraction  - 1 x daily - 7 x weekly - 3 sets - 10 reps - Sit to Stand Without Arm Support  - 1-2 x daily - 7 x weekly - 1-2 sets - 10 reps - Seated Hamstring Stretch  - 3 x daily - 7 x weekly - 1 sets - 3 reps - Seated Correct Posture  - 1 x daily - 7 x weekly - 3 sets - 10 reps  ASSESSMENT:  CLINICAL IMPRESSION: Patient is a 68 y.o. female who was seen today for physical therapy evaluation and treatment for LBP.   PT with history of osteoporosis and pathological fracture of her foot.  She has had chronic LBP and has responded well to treatment in the past.  She reports 0-7/10  LBP that is worse with standing 15-20 min and sitting long periods.  She demonstrates significant forward head posture and rounded shoulders with flexed trunk.  PT educated pt regarding the importance of postural alignment to reduce risk of fracture and falls.  She has tenderness and trigger points in the lumbar spine bilaterally.  Patient will benefit from skilled PT to address the below impairments and improve overall function.   OBJECTIVE IMPAIRMENTS:  Abnormal gait, decreased activity tolerance, decreased knowledge of condition, decreased ROM, hypomobility, increased muscle spasms, improper body mechanics, postural dysfunction, and pain.   ACTIVITY LIMITATIONS: carrying, lifting, bending, and locomotion level  PARTICIPATION LIMITATIONS: meal prep, cleaning, laundry, and community activity  PERSONAL FACTORS: Time since onset of injury/illness/exacerbation and 1 comorbidity: osteoporosis  are also affecting patient's functional outcome.   REHAB POTENTIAL: Good  CLINICAL DECISION MAKING: Stable/uncomplicated  EVALUATION COMPLEXITY: Low   GOALS: Goals reviewed with patient? Yes  SHORT TERM GOALS: Target date: 11/26/2023    Be independent in initial HEP Baseline: Goal status: INITIAL  2.  Report > or = to 30% reduction in LBP with standing ADLs and self-care Baseline: 7/10 Goal status: INITIAL  3.  Verbalize understanding of body mechanics modifications for lumbar protection and to reduce risk of fracture Baseline:  Goal status: INITIAL  4.  Improve Modified Oswestry to > or to 17/50=34% disability  Baseline: 23/50 Goal status: INITIAL    LONG TERM GOALS: Target date: 12/24/2023    Be independent in advanced HEP Baseline:  Goal status: INITIAL  2.  Improve Modified Oswestry to < or = to 11/50=22% disability  Baseline: 23/50=46% Goal status: INITIAL  3.  Report > or = to 70% reduction in LBP with standing ADLs and self-care Baseline:  Goal status: INITIAL  4.  Verbalize understanding of dos and don'ts of osteoporosis  Baseline:  Goal status: INITIAL  5.  Stand for 30 min for ADLs and self-care without limitation due to LBP Baseline:  Goal status: INITIAL   PLAN:  PT FREQUENCY: 2x/week  PT DURATION: 8 weeks  PLANNED INTERVENTIONS: 97110-Therapeutic exercises, 97530- Therapeutic activity, [id number]- Neuromuscular re-education, 97535- Self Care, [id number]- Manual therapy, [id number]- Gait training, [phone]- Canalith  repositioning, [id number]- Aquatic Therapy, [id number]- Electrical stimulation (unattended), [phone]- Electrical stimulation (manual), Patient/Family education, Spinal manipulation, Spinal mobilization, Cryotherapy, and Moist heat.  PLAN FOR NEXT SESSION: review HEP, add chin tucks, osteoporosis education continued, postural strength, core strength    Lorrene Reid, PT 10/29/23 11:04 AM   [facility] [address] [place], [place] [address] Phone # [phone] Fax [phone]

## 2023-10-29 NOTE — Patient Instructions (Signed)
DO's and DON'T's   Avoid and/or Minimize positions of forward bending ( flexion)  Side bending and rotation of the trunk  Especially when movements occur together   When your back aches:   Don't sit down   Lie down on your back with a small pillow under your head and one under your knees or as outlined by our therapist. Or, lie in the 90/90 position ( on the floor with your feet and legs on the sofa with knees and hips bent to 90 degrees)  Tying or putting on your shoes:   Don't bend over to tie your shoes or put on socks.  Instead, bring one foot up, cross it over the opposite knee and bend forward (hinge) at the hips to so the task.  Keep your back straight.  If you cannot do this safely, then you need to use long handled assistive devices such as a shoehorn and sock puller.  Exercising:  Don't engage in ballistic types of exercise routines such as high-impact aerobics or jumping rope  Don't do exercises in the gym that bring you forward (abdominal crunches, sit-ups, touching your  toes, knee-to-chest, straight leg raising.)  Follow a regular exercise program that includes a variety of different weight-bearing activities, such as low-impact aerobics, T' ai chi or walking as your physical therapist advises  Do exercises that emphasize return to normal body alignment and strengthening of the muscles that keep your back straight, as outlined in this program or by your therapist  Household tasks:  Don't reach unnecessarily or twist your trunk when mopping, sweeping, vacuuming, raking, making beds, weeding gardens, getting objects ou of cupboards, etc.  Keep your broom, mop, vacuum, or rake close to you and mover your whole body as you move them. Walk over to the area on which you are working. Arrange kitchen, bathroom, and bedroom shelves so that frequently used items may be reached without excessive bending, twisting, and reaching.  Use a  sturdy stool if necessary.  Don't bend from the waist to pick up something up  Off the floor, out of the trunk of your car, or to brush your teeth, wash your face, etc.   Bend at the knees, keeping back straight as possible. Use a reacher if necessary.   Prevention of fracture is the so-called "BOTTOm -Line" in the management of OSTEOPOROSIS. Do not take unnecessary chances in movement. Once a compression fracture occurs, the process is very difficult to control; one fracture is frequently followed by many more.   

## 2023-11-03 ENCOUNTER — Ambulatory Visit

## 2023-11-03 DIAGNOSIS — R262 Difficulty in walking, not elsewhere classified: Secondary | ICD-10-CM

## 2023-11-03 DIAGNOSIS — M5459 Other low back pain: Secondary | ICD-10-CM

## 2023-11-03 DIAGNOSIS — R252 Cramp and spasm: Secondary | ICD-10-CM

## 2023-11-03 DIAGNOSIS — R293 Abnormal posture: Secondary | ICD-10-CM

## 2023-11-03 DIAGNOSIS — M6281 Muscle weakness (generalized): Secondary | ICD-10-CM

## 2023-11-03 NOTE — Therapy (Addendum)
 OUTPATIENT PHYSICAL THERAPY THORACOLUMBAR TREATMENT   Patient Name: Tracie Boone MRN: [id number] DOB:04/26/1956, 68 y.o., female Today's Date: 11/03/2023  END OF SESSION:  PT End of Session - 11/03/23 0852     Visit Number 2    Date for PT Re-Evaluation 12/24/23    Authorization Type [employer] Medicare-Auth requested    Progress Note Due on Visit 10    PT Start Time 0845    PT Stop Time 0930    PT Time Calculation (min) 45 min    Activity Tolerance Patient tolerated treatment well    Behavior During Therapy WFL for tasks assessed/performed             Past Medical History:  Diagnosis Date   Basal cell carcinoma    Hearing disorder    Hypotensive episode    IBS (irritable bowel syndrome)    Rosacea    Sinusitis    Past Surgical History:  Procedure Laterality Date   AUGMENTATION MAMMAPLASTY Bilateral    BREAST EXCISIONAL BIOPSY Left    Patient Active Problem List   Diagnosis Date Noted   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   Herpes labialis 06/26/2019   Menopause present 06/26/2019   Postmenopausal bleeding 06/26/2019   Right foot pain 06/13/2019    PCP: Jarrett Soho, PA-C   REFERRING PROVIDER: Audrie Gallus, PA  REFERRING DIAG:  Free Text Diagnosis  Intervertebral disc disorders w/ radiculopathy, lumbar region    Rationale for Evaluation on and Treatment: Rehabilitation  THERAPY DIAG:  Other low back pain  Abnormal posture  Cramp and spasm  Difficulty in walking, not elsewhere classified  Muscle weakness (generalized)  ONSET DATE: chronic with flare up   SUBJECTIVE:                                                                                                                                                                                           SUBJECTIVE STATEMENT: Patient reports "just really tired.  Not sleeping well because I can't get comfortable".  "I do feel a little better and the exercises helped".  She rates her pain at 4-5/10.     From MD note on 10/21/23:  Tracie Boone is a pleasant, 68 y.o. female who presents as referral from Dr. Malachy Chamber for evaluation and treatment of osteoporosis. Tracie Boone has been having low back pain with radiculopathy and there is consideration for surgery. However, she is not a prime candidate because she has a T-score -3.1 in her lumbar spine from a recent DEXA scan. She notes history of a right foot stress fracture from no trauma.   PERTINENT HISTORY:  Basal cell  carcinoma, osteoporosis (-3.1 T-score)  PAIN: 10/29/23 Are you having pain? Yes: NPRS scale: 0-7/10 Pain location: bil low back  Pain description: aching Aggravating factors: standing (15-20 min), sitting Relieving factors: Tylenol  PRECAUTIONS: None  RED FLAGS: None   WEIGHT BEARING RESTRICTIONS: No  FALLS:  Has patient fallen in last 6 months? No  LIVING ENVIRONMENT: Lives with: lives with their spouse Lives in: House/apartment   OCCUPATION: retired   PLOF: Independent, Vocation/Vocational requirements: retired, and Leisure: walks 2-3 miles 4-5x/wk   PATIENT GOALS: reduce risk of fracture, reduce LBP  NEXT MD VISIT: April   OBJECTIVE:  Note: Objective measures were completed at Evaluation unless otherwise noted.  DIAGNOSTIC FINDINGS:  See above for t-score information  PATIENT SURVEYS:  10/29/23: Modified Oswestry 20/50=40%   COGNITION: Overall cognitive status: Within functional limits for tasks assessed     SENSATION: WFL  MUSCLE LENGTH: Hamstrings: limited by 25% bil    POSTURE: rounded shoulders, forward head, and flexed trunk   PALPATION: Palpable tenderness over bil lumbar paraspinals and quadratus.  Limited PA mobility with pain T10-L45  LUMBAR ROM:  Sidebending is full, rotation and flexion not tested due to t-score   LOWER EXTREMITY ROM:     Hip flexibility limited by 25% bil without pain   LOWER EXTREMITY MMT:    4+/5 bil LE and UE strengt      GAIT: Distance walked: 50 Assistive device utilized: None Level of assistance: Complete Independence Comments: flexed trunk and guarded movement   TREATMENT DATE:  11/03/23: Nustep x 5 min level 3 (PT present to discuss status) Reviewed all of HEP Added standing shoulder ext with red tband x 10 Added standing shoulder rows with red tband x 10 (standing tolerance is poor, therefore just 10 reps on these for now) Seated bilateral shoulder ER with red tband 2 x 10 Seated bilateral shoulder horizontal abduction 2 x 10 Added standing quad stretch 2 x 30 sec Seated mini sit ups with 5 lb kb 2 x 10  10/29/23:  Findings from evaluation discussed, pt educated on plan of care, HEP initiated.  Do/Don't of osteoporosis to avoid fracture, importance of weight bearing activity and weight training                                                                                                                               PATIENT EDUCATION:  Education details: Access Code: [email or web address] Person educated: Patient Education method: Explanation, Demonstration, and Handouts Education comprehension: verbalized understanding and returned demonstration  HOME EXERCISE PROGRAM: Access Code: [email or web address] URL: [email or web address] Date: 11/03/2023 Prepared by: Mikey Kirschner  Exercises - Seated Scapular Retraction  - 1 x daily - 7 x weekly - 3 sets - 10 reps - Sit to Stand Without Arm Support  - 1-2 x daily - 7 x weekly - 1-2 sets - 10 reps - Seated Hamstring Stretch  - 3 x daily - 7 x weekly - 1 sets -  3 reps - Seated Correct Posture  - 1 x daily - 7 x weekly - 3 sets - 10 reps - Seated Cervical Retraction  - 1 x daily - 7 x weekly - 3 sets - 10 reps - Seated Cervical Rotation AROM  - 1 x daily - 7 x weekly - 3 sets - 10 reps - Seated Cervical Sidebending AROM  - 1 x daily - 7 x weekly - 3 sets - 10 reps - Shoulder extension with resistance - Neutral  - 1 x daily - 7 x weekly - 2 sets - 10  reps - Standing Shoulder Row with Anchored Resistance  - 1 x daily - 7 x weekly - 2 sets - 10 reps - Seated Shoulder External Rotation AAROM with Pulley  - 1 x daily - 7 x weekly - 2 sets - 10 reps - Seated Shoulder Horizontal Abduction with Resistance  - 1 x daily - 7 x weekly - 2 sets - 10 reps - Quadricep Stretch with Chair and Counter Support  - 1 x daily - 7 x weekly - 1 sets - 3 reps - 30 sec hold  ASSESSMENT:  CLINICAL IMPRESSION: Jarah had some questions about her HEP but seems very compliant with these.  She has poor standing tolerance so we modified the amount of time in static standing and instructed how to modify at home.  She is quite guarded and cautious with her movement.  We added resistive postural strengthening and 1 core exercise without any increase in pain.   Patient will benefit from continuing skilled PT to address the below impairments and improve overall function.   OBJECTIVE IMPAIRMENTS: Abnormal gait, decreased activity tolerance, decreased knowledge of condition, decreased ROM, hypomobility, increased muscle spasms, improper body mechanics, postural dysfunction, and pain.   ACTIVITY LIMITATIONS: carrying, lifting, bending, and locomotion level  PARTICIPATION LIMITATIONS: meal prep, cleaning, laundry, and community activity  PERSONAL FACTORS: Time since onset of injury/illness/exacerbation and 1 comorbidity: osteoporosis  are also affecting patient's functional outcome.   REHAB POTENTIAL: Good  CLINICAL DECISION MAKING: Stable/uncomplicated  EVALUATION COMPLEXITY: Low   GOALS: Goals reviewed with patient? Yes  SHORT TERM GOALS: Target date: 11/26/2023    Be independent in initial HEP Baseline: Goal status: INITIAL  2.  Report > or = to 30% reduction in LBP with standing ADLs and self-care Baseline: 7/10 Goal status: INITIAL  3.  Verbalize understanding of body mechanics modifications for lumbar protection and to reduce risk of fracture Baseline:   Goal status: INITIAL  4.  Improve Modified Oswestry to > or to 17/50=34% disability  Baseline: 23/50 Goal status: INITIAL    LONG TERM GOALS: Target date: 12/24/2023    Be independent in advanced HEP Baseline:  Goal status: INITIAL  2.  Improve Modified Oswestry to < or = to 11/50=22% disability  Baseline: 23/50=46% Goal status: INITIAL  3.  Report > or = to 70% reduction in LBP with standing ADLs and self-care Baseline:  Goal status: INITIAL  4.  Verbalize understanding of dos and don'ts of osteoporosis  Baseline:  Goal status: INITIAL  5.  Stand for 30 min for ADLs and self-care without limitation due to LBP Baseline:  Goal status: INITIAL   PLAN:  PT FREQUENCY: 2x/week  PT DURATION: 8 weeks  PLANNED INTERVENTIONS: 97110-Therapeutic exercises, 97530- Therapeutic activity, [id number]- Neuromuscular re-education, 97535- Self Care, [id number]- Manual therapy, [id number]- Gait training, [phone]- Canalith repositioning, [id number]- Aquatic Therapy, [id number]- Electrical stimulation (unattended), [phone]- Electrical stimulation (manual), Patient/Family education,  Spinal manipulation, Spinal mobilization, Cryotherapy, and Moist heat.  PLAN FOR NEXT SESSION: review new exercises adde to HEP, osteoporosis education continued, postural strength, core strength    Jojo Geving B. Braelynne Garinger, PT 11/03/23 9:30 AM [facility] [address] [place], [place] [address] Phone # [phone] Fax [phone]

## 2023-11-06 NOTE — Therapy (Signed)
 OUTPATIENT PHYSICAL THERAPY THORACOLUMBAR TREATMENT   Patient Name: Tracie Boone MRN: [id number] DOB:06-10-1956, 68 y.o., female Today's Date: 11/07/2023  END OF SESSION:  PT End of Session - 11/07/23 1014     Visit Number 3    Date for PT Re-Evaluation 12/24/23    Authorization Type [employer] Medicare-Auth requested    Progress Note Due on Visit 10    PT Start Time 0933    PT Stop Time 1014    PT Time Calculation (min) 41 min    Activity Tolerance Patient tolerated treatment well    Behavior During Therapy WFL for tasks assessed/performed              Past Medical History:  Diagnosis Date   Basal cell carcinoma    Hearing disorder    Hypotensive episode    IBS (irritable bowel syndrome)    Rosacea    Sinusitis    Past Surgical History:  Procedure Laterality Date   AUGMENTATION MAMMAPLASTY Bilateral    BREAST EXCISIONAL BIOPSY Left    Patient Active Problem List   Diagnosis Date Noted   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   Herpes labialis 06/26/2019   Menopause present 06/26/2019   Postmenopausal bleeding 06/26/2019   Right foot pain 06/13/2019    PCP: Jarrett Soho, PA-C   REFERRING PROVIDER: Audrie Gallus, PA  REFERRING DIAG:  Free Text Diagnosis  Intervertebral disc disorders w/ radiculopathy, lumbar region    Rationale for Evaluation on and Treatment: Rehabilitation  THERAPY DIAG:  Other low back pain  Abnormal posture  Cramp and spasm  Difficulty in walking, not elsewhere classified  Muscle weakness (generalized)  ONSET DATE: chronic with flare up   SUBJECTIVE:                                                                                                                                                                                           SUBJECTIVE STATEMENT: My back is a little better. I slept better last night which helps.    From MD note on 10/21/23:  Tracie Boone is a pleasant, 68 y.o. female who presents as referral  from Dr. Malachy Chamber for evaluation and treatment of osteoporosis. [name] has been having low back pain with radiculopathy and there is consideration for surgery. However, she is not a prime candidate because she has a T-score -3.1 in her lumbar spine from a recent DEXA scan. She notes history of a right foot stress fracture from no trauma.   PERTINENT HISTORY:  Basal cell carcinoma, osteoporosis (-3.1 T-score)  PAIN: 10/29/23 Are you having pain? Yes: NPRS scale: 3/10 Pain location: bil  low back  Pain description: aching Aggravating factors: standing (15-20 min), sitting Relieving factors: Tylenol  PRECAUTIONS: None  RED FLAGS: None   WEIGHT BEARING RESTRICTIONS: No  FALLS:  Has patient fallen in last 6 months? No  LIVING ENVIRONMENT: Lives with: lives with their spouse Lives in: House/apartment   OCCUPATION: retired   PLOF: Independent, Vocation/Vocational requirements: retired, and Leisure: walks 2-3 miles 4-5x/wk   PATIENT GOALS: reduce risk of fracture, reduce LBP  NEXT MD VISIT: April   OBJECTIVE:  Note: Objective measures were completed at Evaluation unless otherwise noted.  DIAGNOSTIC FINDINGS:  See above for t-score information  PATIENT SURVEYS:  10/29/23: Modified Oswestry 20/50=40%   COGNITION: Overall cognitive status: Within functional limits for tasks assessed     SENSATION: WFL  MUSCLE LENGTH: Hamstrings: limited by 25% bil    POSTURE: rounded shoulders, forward head, and flexed trunk   PALPATION: Palpable tenderness over bil lumbar paraspinals and quadratus.  Limited PA mobility with pain T10-L45  LUMBAR ROM:  Sidebending is full, rotation and flexion not tested due to t-score   LOWER EXTREMITY ROM:     Hip flexibility limited by 25% bil without pain   LOWER EXTREMITY MMT:    4+/5 bil LE and UE strengt     GAIT: Distance walked: 50 Assistive device utilized: None Level of assistance: Complete Independence Comments: flexed  trunk and guarded movement   TREATMENT DATE:  11/06/23: Nustep x 5 min level 5 (PT present to discuss status) Decompression exercises - see pt instructions: hooklying; hook + shoulder press, hook + chin tuck + head press, leg lengthener, leg lengthener + press Log roll education standing shoulder ext with red tband x 20 standing shoulder rows with red tband x 20  Standing bilateral shoulder ER with red tband 2 x 10 Seated bilateral shoulder horizontal abduction 2 x 10 Sit to stand with 6# 2 x 10 moved position of weight to waist due to feeling strain in back when at chest height. Biceps curl to [facility] press 2# wts x 20 Step ups 6 inch step x 10 B  11/03/23: Nustep x 5 min level 3 (PT present to discuss status) Reviewed all of HEP Added standing shoulder ext with red tband x 10 Added standing shoulder rows with red tband x 10 (standing tolerance is poor, therefore just 10 reps on these for now) Seated bilateral shoulder ER with red tband 2 x 10 Seated bilateral shoulder horizontal abduction 2 x 10 Added standing quad stretch 2 x 30 sec Seated mini sit ups with 5 lb kb 2 x 10  10/29/23:  Findings from evaluation discussed, pt educated on plan of care, HEP initiated.  Do/Don't of osteoporosis to avoid fracture, importance of weight bearing activity and weight training                                                                                                                               PATIENT  EDUCATION:  Education details: Access Code: [email or web address] Person educated: Patient Education method: Explanation, Demonstration, and Handouts Education comprehension: verbalized understanding and returned demonstration  HOME EXERCISE PROGRAM: Access Code: [email or web address] URL: [email or web address] Date: 11/03/2023 Prepared by: Mikey Kirschner  Exercises - Seated Scapular Retraction  - 1 x daily - 7 x weekly - 3 sets - 10 reps - Sit to Stand Without Arm Support  - 1-2 x daily - 7 x weekly  - 1-2 sets - 10 reps - Seated Hamstring Stretch  - 3 x daily - 7 x weekly - 1 sets - 3 reps - Seated Correct Posture  - 1 x daily - 7 x weekly - 3 sets - 10 reps - Seated Cervical Retraction  - 1 x daily - 7 x weekly - 3 sets - 10 reps - Seated Cervical Rotation AROM  - 1 x daily - 7 x weekly - 3 sets - 10 reps - Seated Cervical Sidebending AROM  - 1 x daily - 7 x weekly - 3 sets - 10 reps - Shoulder extension with resistance - Neutral  - 1 x daily - 7 x weekly - 2 sets - 10 reps - Standing Shoulder Row with Anchored Resistance  - 1 x daily - 7 x weekly - 2 sets - 10 reps - Seated Shoulder External Rotation AAROM with Pulley  - 1 x daily - 7 x weekly - 2 sets - 10 reps - Seated Shoulder Horizontal Abduction with Resistance  - 1 x daily - 7 x weekly - 2 sets - 10 reps - Quadricep Stretch with Chair and Counter Support  - 1 x daily - 7 x weekly - 1 sets - 3 reps - 30 sec hold  ASSESSMENT:  CLINICAL IMPRESSION: Suesan reports decreased pain today in her back. Pt educated on decompression exercises to use as needed if she is hurting or to decompress at EOD. Good tolerance to exercises in standing today. She did request to sit down intermittently. Added some weights to TE today with fairly good tolerance.   OBJECTIVE IMPAIRMENTS: Abnormal gait, decreased activity tolerance, decreased knowledge of condition, decreased ROM, hypomobility, increased muscle spasms, improper body mechanics, postural dysfunction, and pain.   ACTIVITY LIMITATIONS: carrying, lifting, bending, and locomotion level  PARTICIPATION LIMITATIONS: meal prep, cleaning, laundry, and community activity  PERSONAL FACTORS: Time since onset of injury/illness/exacerbation and 1 comorbidity: osteoporosis  are also affecting patient's functional outcome.   REHAB POTENTIAL: Good  CLINICAL DECISION MAKING: Stable/uncomplicated  EVALUATION COMPLEXITY: Low   GOALS: Goals reviewed with patient? Yes  SHORT TERM GOALS: Target date:  11/26/2023    Be independent in initial HEP Baseline: Goal status: INITIAL  2.  Report > or = to 30% reduction in LBP with standing ADLs and self-care Baseline: 7/10 Goal status: INITIAL  3.  Verbalize understanding of body mechanics modifications for lumbar protection and to reduce risk of fracture Baseline:  Goal status: INITIAL  4.  Improve Modified Oswestry to > or to 17/50=34% disability  Baseline: 23/50 Goal status: INITIAL    LONG TERM GOALS: Target date: 12/24/2023    Be independent in advanced HEP Baseline:  Goal status: INITIAL  2.  Improve Modified Oswestry to < or = to 11/50=22% disability  Baseline: 23/50=46% Goal status: INITIAL  3.  Report > or = to 70% reduction in LBP with standing ADLs and self-care Baseline:  Goal status: INITIAL  4.  Verbalize understanding of dos and don'ts of osteoporosis  Baseline:  Goal status: INITIAL  5.  Stand for 30 min for ADLs and self-care without limitation due to LBP Baseline:  Goal status: INITIAL   PLAN:  PT FREQUENCY: 2x/week  PT DURATION: 8 weeks  PLANNED INTERVENTIONS: 97110-Therapeutic exercises, 97530- Therapeutic activity, [id number]- Neuromuscular re-education, 97535- Self Care, [id number]- Manual therapy, [id number]- Gait training, [phone]- Canalith repositioning, [id number]- Aquatic Therapy, [id number]- Electrical stimulation (unattended), [phone]- Electrical stimulation (manual), Patient/Family education, Spinal manipulation, Spinal mobilization, Cryotherapy, and Moist heat.  PLAN FOR NEXT SESSION:  postural strength, core strength, WB exercises, ongoing osteoporosis ed    Solon Palm, PT  11/07/23 12:04 PM [facility] [address] [place], [place] [address] Phone # [phone] Fax [phone]

## 2023-11-07 ENCOUNTER — Encounter: Payer: Self-pay | Admitting: Physical Therapy

## 2023-11-07 ENCOUNTER — Ambulatory Visit: Admitting: Physical Therapy

## 2023-11-07 DIAGNOSIS — M6281 Muscle weakness (generalized): Secondary | ICD-10-CM | POA: Diagnosis present

## 2023-11-07 DIAGNOSIS — R262 Difficulty in walking, not elsewhere classified: Secondary | ICD-10-CM

## 2023-11-07 DIAGNOSIS — M5459 Other low back pain: Secondary | ICD-10-CM

## 2023-11-07 DIAGNOSIS — R293 Abnormal posture: Secondary | ICD-10-CM | POA: Diagnosis present

## 2023-11-07 DIAGNOSIS — R252 Cramp and spasm: Secondary | ICD-10-CM | POA: Diagnosis present

## 2023-11-07 NOTE — Patient Instructions (Signed)
RE-ALIGNMENT ROUTINE EXERCISES-OSTEOPROROSIS BASIC FOR POSTURAL CORRECTION   RE-ALIGNMENT Tips BENEFITS: 1.It helps to re-align the curves of the back and improve standing posture. 2.It allows the back muscles to rest and strengthen in preparation for more activity. FREQUENCY: Daily, even after weeks, months and years of more advanced exercises. START: 1.All exercises start in the same position: lying on the back, arms resting on the supporting surface, palms up and slightly away from the body, backs of hands down, knees bent, feet flat. 2.The head, neck, arms, and legs are supported according to specific instructions of your therapist. Copyright  VHI. All rights reserved.    1. Decompression Exercise: Basic.   Takes compression off the vertebral bodies; increases tolerance for lying on the back; helps relieve back pain   Lie on back on firm surface, knees bent, feet flat, arms turned up, out to sides (~35 degrees). Head neck and arms supported as necessary. Time _5-15__ minutes. Surface: floor     2. Shoulder Press  Strengthens upper back extensors and scapular retractors.   Press both shoulders down. Hold _2-3__ seconds. Repeat _3-5__ times. Surface: floor        3. Head Press With Chin Tuck  Strengthens neck extensors   Tuck chin SLIGHTLY toward chest, keep mouth closed. Feel weight on back of head. Increase weight by pressing head down. Hold _2-3__ seconds. Relax. Repeat 3-5___ times. Surface: floor     4. Leg Lengthener: stretches quadratus lumborum and hip flexors.  Strengthens quads and ankle dorsiflexors.  Leg Lengthener: Full    Straighten one leg. Pull toes AND forefoot toward knee, extend heel. Lengthen leg by pulling pelvis away from ribs. Hold __5_ seconds. Relax. Repeat 1 time. Re-bend knee. Do other leg. Each leg __5_ times. Surface: floor    Leg Lengthener / Leg Press Combo: Single Leg    Straighten one leg down to floor. Pull toes AND forefoot  toward knee; extend heel. Lengthen leg by pulling pelvis away from ribs. Press leg down. DO NOT BEND KNEE. Hold _5__ seconds. Relax leg. Repeat exercise 1 time. Relax leg. Re-bend knee. Repeat with other leg. Do 5 times    

## 2023-11-10 ENCOUNTER — Encounter: Payer: Self-pay | Admitting: Physical Therapy

## 2023-11-10 ENCOUNTER — Ambulatory Visit: Admitting: Physical Therapy

## 2023-11-10 DIAGNOSIS — M5459 Other low back pain: Secondary | ICD-10-CM

## 2023-11-10 DIAGNOSIS — R252 Cramp and spasm: Secondary | ICD-10-CM

## 2023-11-10 DIAGNOSIS — R262 Difficulty in walking, not elsewhere classified: Secondary | ICD-10-CM

## 2023-11-10 DIAGNOSIS — M6281 Muscle weakness (generalized): Secondary | ICD-10-CM

## 2023-11-10 DIAGNOSIS — R293 Abnormal posture: Secondary | ICD-10-CM

## 2023-11-10 NOTE — Therapy (Signed)
 OUTPATIENT PHYSICAL THERAPY THORACOLUMBAR TREATMENT   Patient Name: Tracie Boone MRN: [id number] DOB:Feb 26, 1956, 68 y.o., female Today's Date: 11/10/2023  END OF SESSION:  PT End of Session - 11/10/23 1057     Visit Number 4    Date for PT Re-Evaluation 12/24/23    Authorization Type [employer] Medicare-Auth requested    Authorization Time Period 10/29/2023-12/24/2023    Authorization - Visit Number 4    Authorization - Number of Visits 16    Progress Note Due on Visit 10    PT Start Time 1017    PT Stop Time 1052    PT Time Calculation (min) 35 min    Activity Tolerance Patient tolerated treatment well    Behavior During Therapy WFL for tasks assessed/performed               Past Medical History:  Diagnosis Date   Basal cell carcinoma    Hearing disorder    Hypotensive episode    IBS (irritable bowel syndrome)    Rosacea    Sinusitis    Past Surgical History:  Procedure Laterality Date   AUGMENTATION MAMMAPLASTY Bilateral    BREAST EXCISIONAL BIOPSY Left    Patient Active Problem List   Diagnosis Date Noted   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   Herpes labialis 06/26/2019   Menopause present 06/26/2019   Postmenopausal bleeding 06/26/2019   Right foot pain 06/13/2019    PCP: Jarrett Soho, PA-C   REFERRING PROVIDER: Audrie Gallus, PA  REFERRING DIAG:  Free Text Diagnosis  Intervertebral disc disorders w/ radiculopathy, lumbar region    Rationale for Evaluation on and Treatment: Rehabilitation  THERAPY DIAG:  Other low back pain  Abnormal posture  Cramp and spasm  Difficulty in walking, not elsewhere classified  Muscle weakness (generalized)  ONSET DATE: chronic with flare up   SUBJECTIVE:                                                                                                                                                                                           SUBJECTIVE STATEMENT: Pt reports she is tired today she did not sleep  well. The decompression exercises are going well.  From MD note on 10/21/23:  Tracie Boone is a pleasant, 68 y.o. female who presents as referral from Dr. Malachy Chamber for evaluation and treatment of osteoporosis. Tracie Boone has been having low back pain with radiculopathy and there is consideration for surgery. However, she is not a prime candidate because she has a T-score -3.1 in her lumbar spine from a recent DEXA scan. She notes history of a right foot stress fracture from  no trauma.   PERTINENT HISTORY:  Basal cell carcinoma, osteoporosis (-3.1 T-score)  PAIN: 11/10/23 Are you having pain? Yes: NPRS scale: 5/10 Pain location: bil low back  Pain description: aching Aggravating factors: standing (15-20 min), sitting Relieving factors: Tylenol  PRECAUTIONS: None  RED FLAGS: None   WEIGHT BEARING RESTRICTIONS: No  FALLS:  Has patient fallen in last 6 months? No  LIVING ENVIRONMENT: Lives with: lives with their spouse Lives in: House/apartment   OCCUPATION: retired   PLOF: Independent, Vocation/Vocational requirements: retired, and Leisure: walks 2-3 miles 4-5x/wk   PATIENT GOALS: reduce risk of fracture, reduce LBP  NEXT MD VISIT: April   OBJECTIVE:  Note: Objective measures were completed at Evaluation unless otherwise noted.  DIAGNOSTIC FINDINGS:  See above for t-score information  PATIENT SURVEYS:  10/29/23: Modified Oswestry 20/50=40%   COGNITION: Overall cognitive status: Within functional limits for tasks assessed     SENSATION: WFL  MUSCLE LENGTH: Hamstrings: limited by 25% bil    POSTURE: rounded shoulders, forward head, and flexed trunk   PALPATION: Palpable tenderness over bil lumbar paraspinals and quadratus.  Limited PA mobility with pain T10-L45  LUMBAR ROM:  Sidebending is full, rotation and flexion not tested due to t-score   LOWER EXTREMITY ROM:     Hip flexibility limited by 25% bil without pain   LOWER EXTREMITY MMT:     4+/5 bil LE and UE strengt     GAIT: Distance walked: 50 Assistive device utilized: None Level of assistance: Complete Independence Comments: flexed trunk and guarded movement   TREATMENT DATE:  11/10/23: Nustep x 5 min level 5 (PT present to discuss status) Seated blue SB 3 way stretch x 5 each direction Supine LTR x 10 each direction Supine TA activation x 10 Supine TA activation + hip flexion x 8 each LE standing shoulder ext with red tband x 20 standing shoulder rows with red tband x 20  Standing bilateral shoulder ER with red tband 2 x 10 Seated bilateral shoulder horizontal abduction 2 x 10 Sit to stand with 6# 2 x 10 (last set x 7 due to fatigue) Modified deadlift from standard chair with 5# KB 2 x 10 (rest in between)  Seated Biceps curl to [facility] press 2# wts x 20 Seated triceps extension 2# 2 x 10 Step ups 6 inch step x 10 bilateral  Session ended early due to patient's fatigue levels.   11/06/23: Nustep x 5 min level 5 (PT present to discuss status) Decompression exercises - see pt instructions: hooklying; hook + shoulder press, hook + chin tuck + head press, leg lengthener, leg lengthener + press Log roll education standing shoulder ext with red tband x 20 standing shoulder rows with red tband x 20  Standing bilateral shoulder ER with red tband 2 x 10 Seated bilateral shoulder horizontal abduction 2 x 10 Sit to stand with 6# 2 x 10 moved position of weight to waist due to feeling strain in back when at chest height. Biceps curl to [facility] press 2# wts x 20 Step ups 6 inch step x 10 B  11/03/23: Nustep x 5 min level 3 (PT present to discuss status) Reviewed all of HEP Added standing shoulder ext with red tband x 10 Added standing shoulder rows with red tband x 10 (standing tolerance is poor, therefore just 10 reps on these for now) Seated bilateral shoulder ER with red tband 2 x 10 Seated bilateral shoulder horizontal abduction 2 x 10 Added standing quad stretch 2  x  30 sec Seated mini sit ups with 5 lb kb 2 x 10  10/29/23:  Findings from evaluation discussed, pt educated on plan of care, HEP initiated.  Do/Don't of osteoporosis to avoid fracture, importance of weight bearing activity and weight training                                                                                                                               PATIENT EDUCATION:  Education details: Access Code: [email or web address] Person educated: Patient Education method: Explanation, Demonstration, and Handouts Education comprehension: verbalized understanding and returned demonstration  HOME EXERCISE PROGRAM: Access Code: [email or web address] URL: [email or web address] Date: 11/03/2023 Prepared by: Mikey Kirschner  Exercises - Seated Scapular Retraction  - 1 x daily - 7 x weekly - 3 sets - 10 reps - Sit to Stand Without Arm Support  - 1-2 x daily - 7 x weekly - 1-2 sets - 10 reps - Seated Hamstring Stretch  - 3 x daily - 7 x weekly - 1 sets - 3 reps - Seated Correct Posture  - 1 x daily - 7 x weekly - 3 sets - 10 reps - Seated Cervical Retraction  - 1 x daily - 7 x weekly - 3 sets - 10 reps - Seated Cervical Rotation AROM  - 1 x daily - 7 x weekly - 3 sets - 10 reps - Seated Cervical Sidebending AROM  - 1 x daily - 7 x weekly - 3 sets - 10 reps - Shoulder extension with resistance - Neutral  - 1 x daily - 7 x weekly - 2 sets - 10 reps - Standing Shoulder Row with Anchored Resistance  - 1 x daily - 7 x weekly - 2 sets - 10 reps - Seated Shoulder External Rotation AAROM with Pulley  - 1 x daily - 7 x weekly - 2 sets - 10 reps - Seated Shoulder Horizontal Abduction with Resistance  - 1 x daily - 7 x weekly - 2 sets - 10 reps - Quadricep Stretch with Chair and Counter Support  - 1 x daily - 7 x weekly - 1 sets - 3 reps - 30 sec hold  ASSESSMENT:  CLINICAL IMPRESSION: Keita reports moderate back pain today. She has been compliant with updated HEP with lumbar decompression exercises. Kept  session lower intensity due to patient's fatigue. Incorporated modified deadlift for improved technique when picking up objects. Patient required max verbal and visual cues for correct exercise performance. Session ended early due to patient's increased fatigue levels. Patient will benefit from skilled PT to address the below impairments and improve overall function.   OBJECTIVE IMPAIRMENTS: Abnormal gait, decreased activity tolerance, decreased knowledge of condition, decreased ROM, hypomobility, increased muscle spasms, improper body mechanics, postural dysfunction, and pain.   ACTIVITY LIMITATIONS: carrying, lifting, bending, and locomotion level  PARTICIPATION LIMITATIONS: meal prep, cleaning, laundry, and community activity  PERSONAL FACTORS: Time since onset of injury/illness/exacerbation and 1 comorbidity: osteoporosis  are also affecting patient's functional outcome.   REHAB POTENTIAL: Good  CLINICAL DECISION MAKING: Stable/uncomplicated  EVALUATION COMPLEXITY: Low   GOALS: Goals reviewed with patient? Yes  SHORT TERM GOALS: Target date: 11/26/2023    Be independent in initial HEP Baseline: Goal status: INITIAL  2.  Report > or = to 30% reduction in LBP with standing ADLs and self-care Baseline: 7/10 Goal status: INITIAL  3.  Verbalize understanding of body mechanics modifications for lumbar protection and to reduce risk of fracture Baseline:  Goal status: INITIAL  4.  Improve Modified Oswestry to > or to 17/50=34% disability  Baseline: 23/50 Goal status: INITIAL    LONG TERM GOALS: Target date: 12/24/2023    Be independent in advanced HEP Baseline:  Goal status: INITIAL  2.  Improve Modified Oswestry to < or = to 11/50=22% disability  Baseline: 23/50=46% Goal status: INITIAL  3.  Report > or = to 70% reduction in LBP with standing ADLs and self-care Baseline:  Goal status: INITIAL  4.  Verbalize understanding of dos and don'ts of osteoporosis   Baseline:  Goal status: INITIAL  5.  Stand for 30 min for ADLs and self-care without limitation due to LBP Baseline:  Goal status: INITIAL   PLAN:  PT FREQUENCY: 2x/week  PT DURATION: 8 weeks  PLANNED INTERVENTIONS: 97110-Therapeutic exercises, 97530- Therapeutic activity, [id number]- Neuromuscular re-education, 97535- Self Care, 13086- Manual therapy, [id number]- Gait training, [phone]- Canalith repositioning, [id number]- Aquatic Therapy, N6295- Electrical stimulation (unattended), [phone]- Electrical stimulation (manual), Patient/Family education, Spinal manipulation, Spinal mobilization, Cryotherapy, and Moist heat.  PLAN FOR NEXT SESSION:  assess pain levels conitnue postural strength &  core strength as tolerated; leg press   Claude Manges, PT 11/10/23 10:58 AM [facility] [address], [address] [place], [place] [address] Phone # [phone] Fax [phone]

## 2023-11-13 ENCOUNTER — Ambulatory Visit

## 2023-11-13 DIAGNOSIS — M5459 Other low back pain: Secondary | ICD-10-CM | POA: Diagnosis not present

## 2023-11-13 DIAGNOSIS — R293 Abnormal posture: Secondary | ICD-10-CM

## 2023-11-13 DIAGNOSIS — R252 Cramp and spasm: Secondary | ICD-10-CM

## 2023-11-13 DIAGNOSIS — M6281 Muscle weakness (generalized): Secondary | ICD-10-CM

## 2023-11-13 DIAGNOSIS — R262 Difficulty in walking, not elsewhere classified: Secondary | ICD-10-CM

## 2023-11-13 NOTE — Therapy (Signed)
 OUTPATIENT PHYSICAL THERAPY THORACOLUMBAR TREATMENT   Patient Name: Tracie Boone MRN: [id number] DOB:07-20-1956, 68 y.o., female Today's Date: 11/13/2023  END OF SESSION:  PT End of Session - 11/13/23 0933     Visit Number 5    Date for PT Re-Evaluation 12/24/23    Authorization Type [employer] Medicare-Auth requested    Authorization Time Period 10/29/2023-12/24/2023    Authorization - Number of Visits 16    Progress Note Due on Visit 10    PT Start Time 0930    PT Stop Time 1008    PT Time Calculation (min) 38 min    Activity Tolerance Patient tolerated treatment well    Behavior During Therapy WFL for tasks assessed/performed               Past Medical History:  Diagnosis Date   Basal cell carcinoma    Hearing disorder    Hypotensive episode    IBS (irritable bowel syndrome)    Rosacea    Sinusitis    Past Surgical History:  Procedure Laterality Date   AUGMENTATION MAMMAPLASTY Bilateral    BREAST EXCISIONAL BIOPSY Left    Patient Active Problem List   Diagnosis Date Noted   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   Herpes labialis 06/26/2019   Menopause present 06/26/2019   Postmenopausal bleeding 06/26/2019   Right foot pain 06/13/2019    PCP: Jarrett Soho, PA-C   REFERRING PROVIDER: Audrie Gallus, PA  REFERRING DIAG:  Free Text Diagnosis  Intervertebral disc disorders w/ radiculopathy, lumbar region    Rationale for Evaluation on and Treatment: Rehabilitation  THERAPY DIAG:  Other low back pain  Muscle weakness (generalized)  Difficulty in walking, not elsewhere classified  Cramp and spasm  Abnormal posture  ONSET DATE: chronic with flare up   SUBJECTIVE:                                                                                                                                                                                           SUBJECTIVE STATEMENT: Pt reports she is still tired but pain isn't bad.    From MD note on 10/21/23:   Tracie Boone is a pleasant, 68 y.o. female who presents as referral from Dr. Malachy Chamber for evaluation and treatment of osteoporosis. Kaylor has been having low back pain with radiculopathy and there is consideration for surgery. However, she is not a prime candidate because she has a T-score -3.1 in her lumbar spine from a recent DEXA scan. She notes history of a right foot stress fracture from no trauma.   PERTINENT HISTORY:  Basal cell carcinoma, osteoporosis (-3.1 T-score)  PAIN: 11/10/23 Are you having pain? Yes: NPRS scale: 5/10 Pain location: bil low back  Pain description: aching Aggravating factors: standing (15-20 min), sitting Relieving factors: Tylenol  PRECAUTIONS: None  RED FLAGS: None   WEIGHT BEARING RESTRICTIONS: No  FALLS:  Has patient fallen in last 6 months? No  LIVING ENVIRONMENT: Lives with: lives with their spouse Lives in: House/apartment   OCCUPATION: retired   PLOF: Independent, Vocation/Vocational requirements: retired, and Leisure: walks 2-3 miles 4-5x/wk   PATIENT GOALS: reduce risk of fracture, reduce LBP  NEXT MD VISIT: April   OBJECTIVE:  Note: Objective measures were completed at Evaluation unless otherwise noted.  DIAGNOSTIC FINDINGS:  See above for t-score information  PATIENT SURVEYS:  10/29/23: Modified Oswestry 20/50=40%   COGNITION: Overall cognitive status: Within functional limits for tasks assessed     SENSATION: WFL  MUSCLE LENGTH: Hamstrings: limited by 25% bil    POSTURE: rounded shoulders, forward head, and flexed trunk   PALPATION: Palpable tenderness over bil lumbar paraspinals and quadratus.  Limited PA mobility with pain T10-L45  LUMBAR ROM:  Sidebending is full, rotation and flexion not tested due to t-score   LOWER EXTREMITY ROM:     Hip flexibility limited by 25% bil without pain   LOWER EXTREMITY MMT:    4+/5 bil LE and UE strengt     GAIT: Distance walked: 50 Assistive device  utilized: None Level of assistance: Complete Independence Comments: flexed trunk and guarded movement   TREATMENT DATE:  11/13/23: Nustep x 5 min level 5 (PT present to discuss status) Seated blue SB 3 way stretch x 5 each direction Sit to stand with 6# 2 x 10  Seated triceps extension 2# 2 x 10 Seated Biceps curl to OH press 2# wts x 20 Seated bilateral shoulder horizontal abduction with red tband 2 x 10 Seated bilateral shoulder ER with red tband 2 x 10 standing shoulder ext with red tband x 20 standing shoulder rows with red tband x 20  Modified deadlift from standard chair with 5# KB 2 x 10 (rest in between) Supine LTR x 10 each direction Patient agreed to try DN but no rooms were available.  She requested to do this next visit.    11/10/23: Nustep x 5 min level 5 (PT present to discuss status) Seated blue SB 3 way stretch x 5 each direction Supine LTR x 10 each direction Supine TA activation x 10 Supine TA activation + hip flexion x 8 each LE standing shoulder ext with red tband x 20 standing shoulder rows with red tband x 20  Standing bilateral shoulder ER with red tband 2 x 10 Seated bilateral shoulder horizontal abduction 2 x 10 Sit to stand with 6# 2 x 10 (last set x 7 due to fatigue) Modified deadlift from standard chair with 5# KB 2 x 10 (rest in between)  Seated Biceps curl to [facility] press 2# wts x 20 Seated triceps extension 2# 2 x 10 Step ups 6 inch step x 10 bilateral  Session ended early due to patient's fatigue levels.   11/06/23: Nustep x 5 min level 5 (PT present to discuss status) Decompression exercises - see pt instructions: hooklying; hook + shoulder press, hook + chin tuck + head press, leg lengthener, leg lengthener + press Log roll education standing shoulder ext with red tband x 20 standing shoulder rows with red tband x 20  Standing bilateral shoulder ER with red tband 2 x 10 Seated bilateral shoulder horizontal abduction 2  x 10 Sit to stand with 6# 2 x  10 moved position of weight to waist due to feeling strain in back when at chest height. Biceps curl to [facility] press 2# wts x 20 Step ups 6 inch step x 10 B  10/29/23:  Findings from evaluation discussed, pt educated on plan of care, HEP initiated.  Do/Don't of osteoporosis to avoid fracture, importance of weight bearing activity and weight training                                                                                                                               PATIENT EDUCATION:  Education details: Access Code: [email or web address] Person educated: Patient Education method: Explanation, Demonstration, and Handouts Education comprehension: verbalized understanding and returned demonstration  HOME EXERCISE PROGRAM: Access Code: [email or web address] URL: [email or web address] Date: 11/03/2023 Prepared by: Mikey Kirschner  Exercises - Seated Scapular Retraction  - 1 x daily - 7 x weekly - 3 sets - 10 reps - Sit to Stand Without Arm Support  - 1-2 x daily - 7 x weekly - 1-2 sets - 10 reps - Seated Hamstring Stretch  - 3 x daily - 7 x weekly - 1 sets - 3 reps - Seated Correct Posture  - 1 x daily - 7 x weekly - 3 sets - 10 reps - Seated Cervical Retraction  - 1 x daily - 7 x weekly - 3 sets - 10 reps - Seated Cervical Rotation AROM  - 1 x daily - 7 x weekly - 3 sets - 10 reps - Seated Cervical Sidebending AROM  - 1 x daily - 7 x weekly - 3 sets - 10 reps - Shoulder extension with resistance - Neutral  - 1 x daily - 7 x weekly - 2 sets - 10 reps - Standing Shoulder Row with Anchored Resistance  - 1 x daily - 7 x weekly - 2 sets - 10 reps - Seated Shoulder External Rotation AAROM with Pulley  - 1 x daily - 7 x weekly - 2 sets - 10 reps - Seated Shoulder Horizontal Abduction with Resistance  - 1 x daily - 7 x weekly - 2 sets - 10 reps - Quadricep Stretch with Chair and Counter Support  - 1 x daily - 7 x weekly - 1 sets - 3 reps - 30 sec hold  ASSESSMENT:  CLINICAL IMPRESSION: Edona reports  her back pain wasn't bad today.  Rated this 2-3/10.  She was less fatigued and completed all reps of each exercise.  She inquired about DN for her back pain. We discussed this and educated her on this.  She is requesting to do DN next visit.   Patient will benefit from skilled PT to address the below impairments and improve overall function.   OBJECTIVE IMPAIRMENTS: Abnormal gait, decreased activity tolerance, decreased knowledge of condition, decreased ROM, hypomobility, increased muscle spasms, improper body mechanics, postural dysfunction, and pain.   ACTIVITY LIMITATIONS: carrying, lifting,  bending, and locomotion level  PARTICIPATION LIMITATIONS: meal prep, cleaning, laundry, and community activity  PERSONAL FACTORS: Time since onset of injury/illness/exacerbation and 1 comorbidity: osteoporosis  are also affecting patient's functional outcome.   REHAB POTENTIAL: Good  CLINICAL DECISION MAKING: Stable/uncomplicated  EVALUATION COMPLEXITY: Low   GOALS: Goals reviewed with patient? Yes  SHORT TERM GOALS: Target date: 11/26/2023    Be independent in initial HEP Baseline: Goal status: INITIAL  2.  Report > or = to 30% reduction in LBP with standing ADLs and self-care Baseline: 7/10 Goal status: INITIAL  3.  Verbalize understanding of body mechanics modifications for lumbar protection and to reduce risk of fracture Baseline:  Goal status: INITIAL  4.  Improve Modified Oswestry to > or to 17/50=34% disability  Baseline: 23/50 Goal status: INITIAL    LONG TERM GOALS: Target date: 12/24/2023    Be independent in advanced HEP Baseline:  Goal status: INITIAL  2.  Improve Modified Oswestry to < or = to 11/50=22% disability  Baseline: 23/50=46% Goal status: INITIAL  3.  Report > or = to 70% reduction in LBP with standing ADLs and self-care Baseline:  Goal status: INITIAL  4.  Verbalize understanding of dos and don'ts of osteoporosis  Baseline:  Goal status:  INITIAL  5.  Stand for 30 min for ADLs and self-care without limitation due to LBP Baseline:  Goal status: INITIAL   PLAN:  PT FREQUENCY: 2x/week  PT DURATION: 8 weeks  PLANNED INTERVENTIONS: 97110-Therapeutic exercises, 97530- Therapeutic activity, [id number]- Neuromuscular re-education, 97535- Self Care, 86578- Manual therapy, [id number]- Gait training, [phone]- Canalith repositioning, [id number]- Aquatic Therapy, [id number]- Electrical stimulation (unattended), [phone]- Electrical stimulation (manual), Patient/Family education, Spinal manipulation, Spinal mobilization, Cryotherapy, and Moist heat.  PLAN FOR NEXT SESSION:  Patient would like to try DN next visit.  Assess pain levels conitnue postural strength &  core strength as tolerated; leg press   Byrne Capek B. Laurabeth Yip, PT 11/13/23 10:55 AM [facility] [address], [address] [place], [place] [address] Phone # [phone] Fax [phone]

## 2023-11-17 NOTE — Therapy (Signed)
 OUTPATIENT PHYSICAL THERAPY THORACOLUMBAR TREATMENT   Patient Name: Tracie Boone MRN: [id number] DOB:Dec 04, 1955, 68 y.o., female Today's Date: 11/18/2023  END OF SESSION:  PT End of Session - 11/18/23 0847     Visit Number 6    Date for PT Re-Evaluation 12/24/23    Authorization Type [employer] Medicare-Auth requested    Authorization Time Period 10/29/2023-12/24/2023    Authorization - Visit Number 5    Authorization - Number of Visits 16    Progress Note Due on Visit 10    PT Start Time 0847    PT Stop Time 0927    PT Time Calculation (min) 40 min    Activity Tolerance Patient tolerated treatment well    Behavior During Therapy WFL for tasks assessed/performed                Past Medical History:  Diagnosis Date   Basal cell carcinoma    Hearing disorder    Hypotensive episode    IBS (irritable bowel syndrome)    Rosacea    Sinusitis    Past Surgical History:  Procedure Laterality Date   AUGMENTATION MAMMAPLASTY Bilateral    BREAST EXCISIONAL BIOPSY Left    Patient Active Problem List   Diagnosis Date Noted   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   Herpes labialis 06/26/2019   Menopause present 06/26/2019   Postmenopausal bleeding 06/26/2019   Right foot pain 06/13/2019    PCP: [name], PA-C   REFERRING PROVIDER: [name], PA  REFERRING DIAG:  Free Text Diagnosis  Intervertebral disc disorders w/ radiculopathy, lumbar region    Rationale for Evaluation on and Treatment: Rehabilitation  THERAPY DIAG:  Other low back pain  Muscle weakness (generalized)  Difficulty in walking, not elsewhere classified  Cramp and spasm  Abnormal posture  ONSET DATE: chronic with flare up   SUBJECTIVE:                                                                                                                                                                                           SUBJECTIVE STATEMENT: Patient reports her BP has been low in the  mornings and she feels a little woozy.   I've also been having left leg spasms in the past few weeks.   From MD note on 10/21/23:  Tracie Boone is a pleasant, 68 y.o. female who presents as referral from Dr. Ruthell Cowboy for evaluation and treatment of osteoporosis. Harika has been having low back pain with radiculopathy and there is consideration for surgery. However, she is not a prime candidate because she has a T-score -3.1 in her lumbar spine  from a recent DEXA scan. She notes history of a right foot stress fracture from no trauma.   PERTINENT HISTORY:  Basal cell carcinoma, osteoporosis (-3.1 T-score)  PAIN: 11/10/23 Are you having pain? Yes: NPRS scale: 5/10 Pain location: bil low back  Pain description: aching Aggravating factors: standing (15-20 min), sitting Relieving factors: Tylenol  PRECAUTIONS: None  RED FLAGS: None   WEIGHT BEARING RESTRICTIONS: No  FALLS:  Has patient fallen in last 6 months? No  LIVING ENVIRONMENT: Lives with: lives with their spouse Lives in: House/apartment   OCCUPATION: retired   PLOF: Independent, Vocation/Vocational requirements: retired, and Leisure: walks 2-3 miles 4-5x/wk   PATIENT GOALS: reduce risk of fracture, reduce LBP  NEXT MD VISIT: April   OBJECTIVE:  Note: Objective measures were completed at Evaluation unless otherwise noted.  DIAGNOSTIC FINDINGS:  See above for t-score information  PATIENT SURVEYS:  10/29/23: Modified Oswestry 20/50=40%   COGNITION: Overall cognitive status: Within functional limits for tasks assessed     SENSATION: WFL  MUSCLE LENGTH: Hamstrings: limited by 25% bil    POSTURE: rounded shoulders, forward head, and flexed trunk   PALPATION: Palpable tenderness over bil lumbar paraspinals and quadratus.  Limited PA mobility with pain T10-L45  LUMBAR ROM:  Sidebending is full, rotation and flexion not tested due to t-score   LOWER EXTREMITY ROM:     Hip flexibility limited by  25% bil without pain   LOWER EXTREMITY MMT:    4+/5 bil LE and UE strengt     GAIT: Distance walked: 50 Assistive device utilized: None Level of assistance: Complete Independence Comments: flexed trunk and guarded movement   TREATMENT DATE:  11/18/23: Nustep x 5 min level 5 (PT present to discuss status) Sit to stand  6# 2 x 10, also tried 2 6# but upper back form difficult to maintain. Seated triceps extension 3# 2 x 10 Seated Biceps curl to OH press 3# wts x 20 Modified deadlift from standard chair with 5# KB 2 x 10  Prone over pillow alt hip ext 2x10 Trigger Point Dry Needling  Initial Treatment: Pt instructed on Dry Needling rational, procedures, and possible side effects. Pt instructed to expect mild to moderate muscle soreness later in the day and/or into the next day.  Pt instructed in methods to reduce muscle soreness. Pt instructed to continue prescribed HEP. Patient was educated on signs and symptoms of infection and other risk factors and advised to seek medical attention should they occur.  Patient verbalized understanding of these instructions and education.   Patient Verbal Consent Given: Yes Education Handout Provided: Previously Provided Muscles Treated: L4 multifidi B Electrical Stimulation Performed: No Treatment Response/Outcome: Utilized skilled palpation to identify bony landmarks and trigger points.  Able to illicit twitch response and muscle elongation.  Soft tissue mobilization to B lumbar and R thoracic paraspinals following DN to further promote tissue elongation and decreased pain.       11/13/23: Nustep x 5 min level 5 (PT present to discuss status) Seated blue SB 3 way stretch x 5 each direction Sit to stand with 6# 2 x 10  Seated triceps extension 2# 2 x 10 Seated Biceps curl to [facility] press 2# wts x 20 Seated bilateral shoulder horizontal abduction with red tband 2 x 10 Seated bilateral shoulder ER with red tband 2 x 10 standing shoulder ext  with red tband x 20 standing shoulder rows with red tband x 20  Modified deadlift from standard chair with 5# KB 2  x 10 (rest in between) Supine LTR x 10 each direction Patient agreed to try DN but no rooms were available.  She requested to do this next visit.    11/10/23: Nustep x 5 min level 5 (PT present to discuss status) Seated blue SB 3 way stretch x 5 each direction Supine LTR x 10 each direction Supine TA activation x 10 Supine TA activation + hip flexion x 8 each LE standing shoulder ext with red tband x 20 standing shoulder rows with red tband x 20  Standing bilateral shoulder ER with red tband 2 x 10 Seated bilateral shoulder horizontal abduction 2 x 10 Sit to stand with 6# 2 x 10 (last set x 7 due to fatigue) Modified deadlift from standard chair with 5# KB 2 x 10 (rest in between)  Seated Biceps curl to [facility] press 2# wts x 20 Seated triceps extension 2# 2 x 10 Step ups 6 inch step x 10 bilateral  Session ended early due to patient's fatigue levels.   11/06/23: Nustep x 5 min level 5 (PT present to discuss status) Decompression exercises - see pt instructions: hooklying; hook + shoulder press, hook + chin tuck + head press, leg lengthener, leg lengthener + press Log roll education standing shoulder ext with red tband x 20 standing shoulder rows with red tband x 20  Standing bilateral shoulder ER with red tband 2 x 10 Seated bilateral shoulder horizontal abduction 2 x 10 Sit to stand with 6# 2 x 10 moved position of weight to waist due to feeling strain in back when at chest height. Biceps curl to [facility] press 2# wts x 20 Step ups 6 inch step x 10 B  10/29/23:  Findings from evaluation discussed, pt educated on plan of care, HEP initiated.  Do/Don't of osteoporosis to avoid fracture, importance of weight bearing activity and weight training                                                                                                                               PATIENT  EDUCATION:  Education details: Access Code: [email or web address] Person educated: Patient Education method: Explanation, Demonstration, and Handouts Education comprehension: verbalized understanding and returned demonstration  HOME EXERCISE PROGRAM: Access Code: [email or web address] URL: [email or web address] Date: 11/18/2023 Prepared by: Raynelle Fanning  Exercises - Seated Scapular Retraction  - 1 x daily - 7 x weekly - 3 sets - 10 reps - Sit to Stand Without Arm Support  - 1-2 x daily - 7 x weekly - 1-2 sets - 10 reps - Seated Hamstring Stretch  - 3 x daily - 7 x weekly - 1 sets - 3 reps - Seated Correct Posture  - 1 x daily - 7 x weekly - 3 sets - 10 reps - Seated Cervical Retraction  - 1 x daily - 7 x weekly - 3 sets - 10 reps - Seated Cervical Rotation AROM  - 1 x daily - 7 x weekly -  3 sets - 10 reps - Seated Cervical Sidebending AROM  - 1 x daily - 7 x weekly - 3 sets - 10 reps - Shoulder extension with resistance - Neutral  - 1 x daily - 7 x weekly - 2 sets - 10 reps - Standing Shoulder Row with Anchored Resistance  - 1 x daily - 7 x weekly - 2 sets - 10 reps - Seated Shoulder Horizontal Abduction with Resistance  - 1 x daily - 7 x weekly - 2 sets - 10 reps - Seated Bilateral Shoulder External Rotation with Resistance  - 1 x daily - 7 x weekly - 2 sets - 10 reps - Quadricep Stretch with Chair and Counter Support  - 1 x daily - 7 x weekly - 1 sets - 3 reps - 30 sec hold - Prone Hip Extension with Pillow Under Abdomen  - 1 x daily - 3 x weekly - 2 sets - 10 reps  ASSESSMENT:  CLINICAL IMPRESSION: Patient reluctant to try DN after reading handout. We discussed it further and she agreed to try a couple. Good response and no immediate residual soreness. She was able to increase resistance on some of her exercises and we added some lumbar stabilization in prone to help with pain as well.   OBJECTIVE IMPAIRMENTS: Abnormal gait, decreased activity tolerance, decreased knowledge of condition, decreased  ROM, hypomobility, increased muscle spasms, improper body mechanics, postural dysfunction, and pain.   ACTIVITY LIMITATIONS: carrying, lifting, bending, and locomotion level  PARTICIPATION LIMITATIONS: meal prep, cleaning, laundry, and community activity  PERSONAL FACTORS: Time since onset of injury/illness/exacerbation and 1 comorbidity: osteoporosis  are also affecting patient's functional outcome.   REHAB POTENTIAL: Good  CLINICAL DECISION MAKING: Stable/uncomplicated  EVALUATION COMPLEXITY: Low   GOALS: Goals reviewed with patient? Yes  SHORT TERM GOALS: Target date: 11/26/2023    Be independent in initial HEP Baseline: Goal status: INITIAL  2.  Report > or = to 30% reduction in LBP with standing ADLs and self-care Baseline: 7/10 Goal status: INITIAL  3.  Verbalize understanding of body mechanics modifications for lumbar protection and to reduce risk of fracture Baseline:  Goal status: INITIAL  4.  Improve Modified Oswestry to > or to 17/50=34% disability  Baseline: 23/50 Goal status: INITIAL    LONG TERM GOALS: Target date: 12/24/2023    Be independent in advanced HEP Baseline:  Goal status: INITIAL  2.  Improve Modified Oswestry to < or = to 11/50=22% disability  Baseline: 23/50=46% Goal status: INITIAL  3.  Report > or = to 70% reduction in LBP with standing ADLs and self-care Baseline:  Goal status: INITIAL  4.  Verbalize understanding of dos and don'ts of osteoporosis  Baseline:  Goal status: INITIAL  5.  Stand for 30 min for ADLs and self-care without limitation due to LBP Baseline:  Goal status: INITIAL   PLAN:  PT FREQUENCY: 2x/week  PT DURATION: 8 weeks  PLANNED INTERVENTIONS: 97110-Therapeutic exercises, 97530- Therapeutic activity, [id number]- Neuromuscular re-education, 97535- Self Care, [id number]- Manual therapy, [id number]- Gait training, [phone]- Canalith repositioning, [id number]- Aquatic Therapy, M0102- Electrical stimulation (unattended), [phone]-  Electrical stimulation (manual), Patient/Family education, Spinal manipulation, Spinal mobilization, Cryotherapy, and Moist heat.  PLAN FOR NEXT SESSION:  Patient would like to try DN next visit.  Assess pain levels conitnue postural strength &  core strength as tolerated; leg press   Jinx Mourning, PT  11/18/23 9:32 AM [facility] [address], [address] [place], [place] [address] Phone #  [phone] Fax [phone]

## 2023-11-18 ENCOUNTER — Encounter: Payer: Self-pay | Admitting: Physical Therapy

## 2023-11-18 ENCOUNTER — Ambulatory Visit: Admitting: Physical Therapy

## 2023-11-18 VITALS — BP 127/84 | HR 72

## 2023-11-18 DIAGNOSIS — R293 Abnormal posture: Secondary | ICD-10-CM

## 2023-11-18 DIAGNOSIS — R262 Difficulty in walking, not elsewhere classified: Secondary | ICD-10-CM

## 2023-11-18 DIAGNOSIS — M5459 Other low back pain: Secondary | ICD-10-CM | POA: Diagnosis not present

## 2023-11-18 DIAGNOSIS — R252 Cramp and spasm: Secondary | ICD-10-CM

## 2023-11-18 DIAGNOSIS — M6281 Muscle weakness (generalized): Secondary | ICD-10-CM

## 2023-11-24 ENCOUNTER — Ambulatory Visit: Admitting: Physical Therapy

## 2023-11-24 ENCOUNTER — Encounter: Payer: Self-pay | Admitting: Physical Therapy

## 2023-11-24 DIAGNOSIS — M6281 Muscle weakness (generalized): Secondary | ICD-10-CM

## 2023-11-24 DIAGNOSIS — R252 Cramp and spasm: Secondary | ICD-10-CM

## 2023-11-24 DIAGNOSIS — M5459 Other low back pain: Secondary | ICD-10-CM

## 2023-11-24 DIAGNOSIS — R293 Abnormal posture: Secondary | ICD-10-CM

## 2023-11-24 DIAGNOSIS — R262 Difficulty in walking, not elsewhere classified: Secondary | ICD-10-CM

## 2023-11-24 NOTE — Therapy (Signed)
 OUTPATIENT PHYSICAL THERAPY THORACOLUMBAR TREATMENT   Patient Name: Tracie Boone MRN: [id number] DOB:1955-12-04, 68 y.o., female Today's Date: 11/24/2023  END OF SESSION:  PT End of Session - 11/24/23 1014     Visit Number 7    Date for PT Re-Evaluation 12/24/23    Authorization Type [employer] Medicare-Auth requested    Authorization Time Period 10/29/2023-12/24/2023    Authorization - Visit Number 6    Authorization - Number of Visits 16    Progress Note Due on Visit 10    PT Start Time 0931    PT Stop Time 1013    PT Time Calculation (min) 42 min    Activity Tolerance Patient tolerated treatment well    Behavior During Therapy WFL for tasks assessed/performed                 Past Medical History:  Diagnosis Date   Basal cell carcinoma    Hearing disorder    Hypotensive episode    IBS (irritable bowel syndrome)    Rosacea    Sinusitis    Past Surgical History:  Procedure Laterality Date   AUGMENTATION MAMMAPLASTY Bilateral    BREAST EXCISIONAL BIOPSY Left    Patient Active Problem List   Diagnosis Date Noted   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   Herpes labialis 06/26/2019   Menopause present 06/26/2019   Postmenopausal bleeding 06/26/2019   Right foot pain 06/13/2019    PCP: [name], PA-C   REFERRING PROVIDER: [name], PA  REFERRING DIAG:  Free Text Diagnosis  Intervertebral disc disorders w/ radiculopathy, lumbar region    Rationale for Evaluation on and Treatment: Rehabilitation  THERAPY DIAG:  Other low back pain  Difficulty in walking, not elsewhere classified  Muscle weakness (generalized)  Cramp and spasm  Abnormal posture  ONSET DATE: chronic with flare up   SUBJECTIVE:                                                                                                                                                                                           SUBJECTIVE STATEMENT: Patient reports yesterday her back pain was  really flared up. She felt really stiff. She is not currently having pain right now.  From MD note on 10/21/23:  [name] is a pleasant, 68 y.o. female who presents as referral from Dr. Ruthell Cowboy for evaluation and treatment of osteoporosis. Katilynn has been having low back pain with radiculopathy and there is consideration for surgery. However, she is not a prime candidate because she has a T-score -3.1 in her lumbar spine from a recent DEXA scan. She notes history  of a right foot stress fracture from no trauma.   PERTINENT HISTORY:  Basal cell carcinoma, osteoporosis (-3.1 T-score)  PAIN: 11/24/23 Are you having pain? Yes: NPRS scale: 0/10 Pain location: bil low back  Pain description: aching Aggravating factors: standing (15-20 min), sitting Relieving factors: Tylenol  PRECAUTIONS: None  RED FLAGS: None   WEIGHT BEARING RESTRICTIONS: No  FALLS:  Has patient fallen in last 6 months? No  LIVING ENVIRONMENT: Lives with: lives with their spouse Lives in: House/apartment   OCCUPATION: retired   PLOF: Independent, Vocation/Vocational requirements: retired, and Leisure: walks 2-3 miles 4-5x/wk   PATIENT GOALS: reduce risk of fracture, reduce LBP  NEXT MD VISIT: April   OBJECTIVE:  Note: Objective measures were completed at Evaluation unless otherwise noted.  DIAGNOSTIC FINDINGS:  See above for t-score information  PATIENT SURVEYS:  10/29/23: Modified Oswestry 20/50=40%   COGNITION: Overall cognitive status: Within functional limits for tasks assessed     SENSATION: WFL  MUSCLE LENGTH: Hamstrings: limited by 25% bil    POSTURE: rounded shoulders, forward head, and flexed trunk   PALPATION: Palpable tenderness over bil lumbar paraspinals and quadratus.  Limited PA mobility with pain T10-L45  LUMBAR ROM:  Sidebending is full, rotation and flexion not tested due to t-score   LOWER EXTREMITY ROM:     Hip flexibility limited by 25% bil without  pain   LOWER EXTREMITY MMT:    4+/5 bil LE and UE strengt     GAIT: Distance walked: 50 Assistive device utilized: None Level of assistance: Complete Independence Comments: flexed trunk and guarded movement   TREATMENT DATE:  11/24/23: Nustep x 6 min level 5 (PT present to discuss status) Single leg on airex + shoulder row with red TB x 10 each leg Single leg on airex + shoulder extension  with red TB x 10 each leg Sit to stand  6# + chest press x 10 Sit to stand + over head reach 6# DB x 10 Standing OH press 3# DB 2 x 10 Standing triceps extension 3# DB 2 x 10 Modified deadlift from standard chair with 6# KB 2 x 10  Step ups at stair unilateral 6# DB hold x 20 bilateral  Standing on airex Seated bilateral shoulder horizontal abduction with red tband 2 x 10 Standing on airex Seated bilateral shoulder ER with red tband 2 x 10 Seated chin tucks x 20 Hurdles (forwards/ sideways) x 3 laps Object retrieval (verbal and visual cues for correct performance)  Leg Press 70# 2 x 10 bilateral ; 40# unilateral x 10     11/18/23: Nustep x 5 min level 5 (PT present to discuss status) Sit to stand  6# 2 x 10, also tried 2 6# but upper back form difficult to maintain. Seated triceps extension 3# 2 x 10 Seated Biceps curl to OH press 3# wts x 20 Modified deadlift from standard chair with 5# KB 2 x 10  Prone over pillow alt hip ext 2x10 Trigger Point Dry Needling  Initial Treatment: Pt instructed on Dry Needling rational, procedures, and possible side effects. Pt instructed to expect mild to moderate muscle soreness later in the day and/or into the next day.  Pt instructed in methods to reduce muscle soreness. Pt instructed to continue prescribed HEP. Patient was educated on signs and symptoms of infection and other risk factors and advised to seek medical attention should they occur.  Patient verbalized understanding of these instructions and education.   Patient Verbal Consent Given:  Yes Education Handout Provided: Previously Provided Muscles Treated: L4 multifidi B Electrical Stimulation Performed: No Treatment Response/Outcome: Utilized skilled palpation to identify bony landmarks and trigger points.  Able to illicit twitch response and muscle elongation.  Soft tissue mobilization to B lumbar and R thoracic paraspinals following DN to further promote tissue elongation and decreased pain.       11/13/23: Nustep x 5 min level 5 (PT present to discuss status) Seated blue SB 3 way stretch x 5 each direction Sit to stand with 6# 2 x 10  Seated triceps extension 2# 2 x 10 Seated Biceps curl to [facility] press 2# wts x 20 Seated bilateral shoulder horizontal abduction with red tband 2 x 10 Seated bilateral shoulder ER with red tband 2 x 10 standing shoulder ext with red tband x 20 standing shoulder rows with red tband x 20  Modified deadlift from standard chair with 5# KB 2 x 10 (rest in between) Supine LTR x 10 each direction Patient agreed to try DN but no rooms were available.  She requested to do this next visit.                                                                                                                                    PATIENT EDUCATION:  Education details: Access Code: [email or web address] Person educated: Patient Education method: Explanation, Demonstration, and Handouts Education comprehension: verbalized understanding and returned demonstration  HOME EXERCISE PROGRAM: Access Code: [email or web address] URL: [email or web address] Date: 11/24/2023 Prepared by: Penelope Bowie  Exercises - Seated Scapular Retraction  - 1 x daily - 7 x weekly - 3 sets - 10 reps - Sit to Stand Without Arm Support  - 1-2 x daily - 7 x weekly - 1-2 sets - 10 reps - Seated Hamstring Stretch  - 3 x daily - 7 x weekly - 1 sets - 3 reps - Seated Correct Posture  - 1 x daily - 7 x weekly - 3 sets - 10 reps - Seated Cervical Retraction  - 1 x daily - 7 x weekly - 3 sets - 10  reps - Seated Cervical Rotation AROM  - 1 x daily - 7 x weekly - 3 sets - 10 reps - Seated Cervical Sidebending AROM  - 1 x daily - 7 x weekly - 3 sets - 10 reps - Shoulder extension with resistance - Neutral  - 1 x daily - 7 x weekly - 2 sets - 10 reps - Standing Shoulder Row with Anchored Resistance  - 1 x daily - 7 x weekly - 2 sets - 10 reps - Seated Shoulder Horizontal Abduction with Resistance  - 1 x daily - 7 x weekly - 2 sets - 10 reps - Seated Bilateral Shoulder External Rotation with Resistance  - 1 x daily - 7 x weekly - 2 sets - 10 reps - Quadricep Stretch with Chair and Counter Support  - 1 x daily -  7 x weekly - 1 sets - 3 reps - 30 sec hold - Prone Hip Extension with Pillow Under Abdomen  - 1 x daily - 3 x weekly - 2 sets - 10 reps - Squat with Chest Press  - 1 x daily - 7 x weekly - 1 sets - 10 reps - Seated Overhead Reach Stretch  - 1 x daily - 7 x weekly - 1 sets - 10 reps  ASSESSMENT:  CLINICAL IMPRESSION: Theodora presented to therapy with no back pain. She verbalized she had some back pain over the weekend and she had increased stiffness. Updated patient's HEP to include strengthening progressions. Overall, patient tolerated treatment session well and did not verbalize any increased pain or discomfort. She required moderate verbal and visual cues for correct exercise performance. Educated patient on correct postural alignment and how we are addressing it in therapy. Frequently throughout session patient required verbal cues for proper cervical alignment. Patient will benefit from skilled PT to address the below impairments and improve overall function.    OBJECTIVE IMPAIRMENTS: Abnormal gait, decreased activity tolerance, decreased knowledge of condition, decreased ROM, hypomobility, increased muscle spasms, improper body mechanics, postural dysfunction, and pain.   ACTIVITY LIMITATIONS: carrying, lifting, bending, and locomotion level  PARTICIPATION LIMITATIONS: meal prep,  cleaning, laundry, and community activity  PERSONAL FACTORS: Time since onset of injury/illness/exacerbation and 1 comorbidity: osteoporosis  are also affecting patient's functional outcome.   REHAB POTENTIAL: Good  CLINICAL DECISION MAKING: Stable/uncomplicated  EVALUATION COMPLEXITY: Low   GOALS: Goals reviewed with patient? Yes  SHORT TERM GOALS: Target date: 11/26/2023    Be independent in initial HEP Baseline: Goal status: INITIAL  2.  Report > or = to 30% reduction in LBP with standing ADLs and self-care Baseline: 7/10 Goal status: INITIAL  3.  Verbalize understanding of body mechanics modifications for lumbar protection and to reduce risk of fracture Baseline:  Goal status: INITIAL  4.  Improve Modified Oswestry to > or to 17/50=34% disability  Baseline: 23/50 Goal status: INITIAL    LONG TERM GOALS: Target date: 12/24/2023    Be independent in advanced HEP Baseline:  Goal status: INITIAL  2.  Improve Modified Oswestry to < or = to 11/50=22% disability  Baseline: 23/50=46% Goal status: INITIAL  3.  Report > or = to 70% reduction in LBP with standing ADLs and self-care Baseline:  Goal status: INITIAL  4.  Verbalize understanding of dos and don'ts of osteoporosis  Baseline:  Goal status: INITIAL  5.  Stand for 30 min for ADLs and self-care without limitation due to LBP Baseline:  Goal status: INITIAL   PLAN:  PT FREQUENCY: 2x/week  PT DURATION: 8 weeks  PLANNED INTERVENTIONS: 97110-Therapeutic exercises, 97530- Therapeutic activity, [id number]- Neuromuscular re-education, 97535- Self Care, 16109- Manual therapy, [id number]- Gait training, [phone]- Canalith repositioning, [id number]- Aquatic Therapy, [id number]- Electrical stimulation (unattended), [phone]- Electrical stimulation (manual), Patient/Family education, Spinal manipulation, Spinal mobilization, Cryotherapy, and Moist heat.  PLAN FOR NEXT SESSION:  Assess pain levels conitnue postural strength &  core strength  as tolerated; manual/ DN as indicated  Penelope Bowie, PT 11/24/23 10:14 AM [facility] [address] [place], [place] [address] Phone # [phone] Fax [phone]

## 2023-11-27 ENCOUNTER — Encounter: Admitting: Physical Therapy

## 2023-12-02 ENCOUNTER — Ambulatory Visit

## 2023-12-02 DIAGNOSIS — R262 Difficulty in walking, not elsewhere classified: Secondary | ICD-10-CM

## 2023-12-02 DIAGNOSIS — M5459 Other low back pain: Secondary | ICD-10-CM

## 2023-12-02 DIAGNOSIS — R293 Abnormal posture: Secondary | ICD-10-CM

## 2023-12-02 DIAGNOSIS — R252 Cramp and spasm: Secondary | ICD-10-CM

## 2023-12-02 DIAGNOSIS — M6281 Muscle weakness (generalized): Secondary | ICD-10-CM

## 2023-12-02 NOTE — Therapy (Signed)
 OUTPATIENT PHYSICAL THERAPY THORACOLUMBAR TREATMENT   Patient Name: Tracie Boone MRN: [id number] DOB:23-Jun-1956, 68 y.o., female Today's Date: 12/02/2023  END OF SESSION:  PT End of Session - 12/02/23 1020     Visit Number 8    Date for PT Re-Evaluation 12/24/23    Authorization Type [employer] Medicare-Auth requested    Authorization Time Period 10/29/2023-12/24/2023    Authorization - Visit Number 7    Authorization - Number of Visits 16    Progress Note Due on Visit 10    PT Start Time 1020    PT Stop Time 1100    PT Time Calculation (min) 40 min    Activity Tolerance Patient tolerated treatment well    Behavior During Therapy WFL for tasks assessed/performed                 Past Medical History:  Diagnosis Date   Basal cell carcinoma    Hearing disorder    Hypotensive episode    IBS (irritable bowel syndrome)    Rosacea    Sinusitis    Past Surgical History:  Procedure Laterality Date   AUGMENTATION MAMMAPLASTY Bilateral    BREAST EXCISIONAL BIOPSY Left    Patient Active Problem List   Diagnosis Date Noted   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   Herpes labialis 06/26/2019   Menopause present 06/26/2019   Postmenopausal bleeding 06/26/2019   Right foot pain 06/13/2019    PCP: Tracie Elders, PA-C   REFERRING PROVIDER: [name], PA  REFERRING DIAG:  Free Text Diagnosis  Intervertebral disc disorders w/ radiculopathy, lumbar region    Rationale for Evaluation on and Treatment: Rehabilitation  THERAPY DIAG:  Other low back pain  Difficulty in walking, not elsewhere classified  Muscle weakness (generalized)  Cramp and spasm  Abnormal posture  ONSET DATE: chronic with flare up   SUBJECTIVE:                                                                                                                                                                                           SUBJECTIVE STATEMENT: Patient reports "not doing good today".  I  started a new medication for the bone density and I think its making me stiff.  My back is really hurting too.  The doctor is considering doing ablation.    From MD note on 10/21/23:  Tracie Boone is a pleasant, 68 y.o. female who presents as referral from Dr. Ruthell Boone for evaluation and treatment of osteoporosis. [name] has been having low back pain with radiculopathy and there is consideration for surgery. However, she is not a prime candidate because  she has a T-score -3.1 in her lumbar spine from a recent DEXA scan. She notes history of a right foot stress fracture from no trauma.   PERTINENT HISTORY:  Basal cell carcinoma, osteoporosis (-3.1 T-score)  PAIN: 12/02/23 Are you having pain? Yes: NPRS scale: 4/10 Pain location: bil low back  Pain description: aching Aggravating factors: standing (15-20 min), sitting Relieving factors: Tylenol  PRECAUTIONS: None  RED FLAGS: None   WEIGHT BEARING RESTRICTIONS: No  FALLS:  Has patient fallen in last 6 months? No  LIVING ENVIRONMENT: Lives with: lives with their spouse Lives in: House/apartment   OCCUPATION: retired   PLOF: Independent, Vocation/Vocational requirements: retired, and Leisure: walks 2-3 miles 4-5x/wk   PATIENT GOALS: reduce risk of fracture, reduce LBP  NEXT MD VISIT: April   OBJECTIVE:  Note: Objective measures were completed at Evaluation unless otherwise noted.  DIAGNOSTIC FINDINGS:  See above for t-score information  PATIENT SURVEYS:  10/29/23: Modified Oswestry 20/50=40%   COGNITION: Overall cognitive status: Within functional limits for tasks assessed     SENSATION: WFL  MUSCLE LENGTH: Hamstrings: limited by 25% bil    POSTURE: rounded shoulders, forward head, and flexed trunk   PALPATION: Palpable tenderness over bil lumbar paraspinals and quadratus.  Limited PA mobility with pain T10-L45  LUMBAR ROM:  Sidebending is full, rotation and flexion not tested due to t-score    LOWER EXTREMITY ROM:     Hip flexibility limited by 25% bil without pain   LOWER EXTREMITY MMT:    4+/5 bil LE and UE strengt     GAIT: Distance walked: 50 Assistive device utilized: None Level of assistance: Complete Independence Comments: flexed trunk and guarded movement   TREATMENT DATE:  12/02/23: Recumbent bike x 5 min level 5 (PT present to discuss status) Standing hamstring stretch 3 x 30 sec at steps Standing quad/hip flexor stretch 3 x 30 sec at steps Seated piriformis stretch 3 x 30 sec  Supine lower trunk rotation x 20 Supine PPT x 20 Supine PPT x 20 with marching Supine SKTC x 5 each LE holding 5 sec Open book x 10 each side Seated 3 direction physio ball roll outs x 10 each direction with blue ball  11/24/23: Nustep x 6 min level 5 (PT present to discuss status) Single leg on airex + shoulder row with red TB x 10 each leg Single leg on airex + shoulder extension  with red TB x 10 each leg Sit to stand  6# + chest press x 10 Sit to stand + over head reach 6# DB x 10 Standing OH press 3# DB 2 x 10 Standing triceps extension 3# DB 2 x 10 Modified deadlift from standard chair with 6# KB 2 x 10  Step ups at stair unilateral 6# DB hold x 20 bilateral  Standing on airex Seated bilateral shoulder horizontal abduction with red tband 2 x 10 Standing on airex Seated bilateral shoulder ER with red tband 2 x 10 Seated chin tucks x 20 Hurdles (forwards/ sideways) x 3 laps Object retrieval (verbal and visual cues for correct performance)  Leg Press 70# 2 x 10 bilateral ; 40# unilateral x 10  11/18/23: Nustep x 5 min level 5 (PT present to discuss status) Sit to stand  6# 2 x 10, also tried 2 6# but upper back form difficult to maintain. Seated triceps extension 3# 2 x 10 Seated Biceps curl to [facility] press 3# wts x 20 Modified deadlift from standard chair with 5# KB  2 x 10  Prone over pillow alt hip ext 2x10 Trigger Point Dry Needling  Initial Treatment: Pt  instructed on Dry Needling rational, procedures, and possible side effects. Pt instructed to expect mild to moderate muscle soreness later in the day and/or into the next day.  Pt instructed in methods to reduce muscle soreness. Pt instructed to continue prescribed HEP. Patient was educated on signs and symptoms of infection and other risk factors and advised to seek medical attention should they occur.  Patient verbalized understanding of these instructions and education.   Patient Verbal Consent Given: Yes Education Handout Provided: Previously Provided Muscles Treated: L4 multifidi B Electrical Stimulation Performed: No Treatment Response/Outcome: Utilized skilled palpation to identify bony landmarks and trigger points.  Able to illicit twitch response and muscle elongation.  Soft tissue mobilization to B lumbar and R thoracic paraspinals following DN to further promote tissue elongation and decreased pain.                                                                                                                                  PATIENT EDUCATION:  Education details: Access Code: [email or web address] Person educated: Patient Education method: Explanation, Demonstration, and Handouts Education comprehension: verbalized understanding and returned demonstration  HOME EXERCISE PROGRAM: Access Code: [email or web address] URL: [email or web address] Date: 11/24/2023 Prepared by: Penelope Bowie  Exercises - Seated Scapular Retraction  - 1 x daily - 7 x weekly - 3 sets - 10 reps - Sit to Stand Without Arm Support  - 1-2 x daily - 7 x weekly - 1-2 sets - 10 reps - Seated Hamstring Stretch  - 3 x daily - 7 x weekly - 1 sets - 3 reps - Seated Correct Posture  - 1 x daily - 7 x weekly - 3 sets - 10 reps - Seated Cervical Retraction  - 1 x daily - 7 x weekly - 3 sets - 10 reps - Seated Cervical Rotation AROM  - 1 x daily - 7 x weekly - 3 sets - 10 reps - Seated Cervical Sidebending AROM  - 1 x daily - 7  x weekly - 3 sets - 10 reps - Shoulder extension with resistance - Neutral  - 1 x daily - 7 x weekly - 2 sets - 10 reps - Standing Shoulder Row with Anchored Resistance  - 1 x daily - 7 x weekly - 2 sets - 10 reps - Seated Shoulder Horizontal Abduction with Resistance  - 1 x daily - 7 x weekly - 2 sets - 10 reps - Seated Bilateral Shoulder External Rotation with Resistance  - 1 x daily - 7 x weekly - 2 sets - 10 reps - Quadricep Stretch with Chair and Counter Support  - 1 x daily - 7 x weekly - 1 sets - 3 reps - 30 sec hold - Prone Hip Extension with Pillow Under Abdomen  - 1 x daily - 3 x weekly -  2 sets - 10 reps - Squat with Chest Press  - 1 x daily - 7 x weekly - 1 sets - 10 reps - Seated Overhead Reach Stretch  - 1 x daily - 7 x weekly - 1 sets - 10 reps  ASSESSMENT:  CLINICAL IMPRESSION: Tamberly presented to therapy with increased back pain today. She seems to be moving a little slower today and was not able to extend far at all during physio ball roll outs.  She responded well to gentle stretching in supine but was also having some neck pain while in supine.  Place extra cushion (balance pad) under her pillow and this resolved.  She was not able to do much in the way of core strengthening today.  Patient will benefit from skilled PT to address the below impairments and improve overall function.    OBJECTIVE IMPAIRMENTS: Abnormal gait, decreased activity tolerance, decreased knowledge of condition, decreased ROM, hypomobility, increased muscle spasms, improper body mechanics, postural dysfunction, and pain.   ACTIVITY LIMITATIONS: carrying, lifting, bending, and locomotion level  PARTICIPATION LIMITATIONS: meal prep, cleaning, laundry, and community activity  PERSONAL FACTORS: Time since onset of injury/illness/exacerbation and 1 comorbidity: osteoporosis  are also affecting patient's functional outcome.   REHAB POTENTIAL: Good  CLINICAL DECISION MAKING:  Stable/uncomplicated  EVALUATION COMPLEXITY: Low   GOALS: Goals reviewed with patient? Yes  SHORT TERM GOALS: Target date: 11/26/2023    Be independent in initial HEP Baseline: Goal status: MET 12/02/23  2.  Report > or = to 30% reduction in LBP with standing ADLs and self-care Baseline: 7/10 Goal status: In Progress  3.  Verbalize understanding of body mechanics modifications for lumbar protection and to reduce risk of fracture Baseline:  Goal status: MET 12/02/23  4.  Improve Modified Oswestry to > or to 17/50=34% disability  Baseline: 23/50 Goal status: In Progress    LONG TERM GOALS: Target date: 12/24/2023    Be independent in advanced HEP Baseline:  Goal status: INITIAL  2.  Improve Modified Oswestry to < or = to 11/50=22% disability  Baseline: 23/50=46% Goal status: INITIAL  3.  Report > or = to 70% reduction in LBP with standing ADLs and self-care Baseline:  Goal status: INITIAL  4.  Verbalize understanding of dos and don'ts of osteoporosis  Baseline:  Goal status: INITIAL  5.  Stand for 30 min for ADLs and self-care without limitation due to LBP Baseline:  Goal status: INITIAL   PLAN:  PT FREQUENCY: 2x/week  PT DURATION: 8 weeks  PLANNED INTERVENTIONS: 97110-Therapeutic exercises, 97530- Therapeutic activity, [id number]- Neuromuscular re-education, 97535- Self Care, [id number]- Manual therapy, [id number]- Gait training, [phone]- Canalith repositioning, [id number]- Aquatic Therapy, Q6578- Electrical stimulation (unattended), [phone]- Electrical stimulation (manual), Patient/Family education, Spinal manipulation, Spinal mobilization, Cryotherapy, and Moist heat.  PLAN FOR NEXT SESSION:  Assess affect that new medication is having on her overall function, conitnue postural strength &  core strength as tolerated; manual/ DN as indicated  Rihanna Marseille B. Avaiah Stempel, PT 12/02/23 12:18 PM [facility] [address] [place], [place] [address] Phone  # [phone] Fax [phone]

## 2023-12-04 ENCOUNTER — Ambulatory Visit: Admitting: Physical Therapy

## 2023-12-08 ENCOUNTER — Ambulatory Visit

## 2023-12-08 DIAGNOSIS — R293 Abnormal posture: Secondary | ICD-10-CM | POA: Insufficient documentation

## 2023-12-08 DIAGNOSIS — R262 Difficulty in walking, not elsewhere classified: Secondary | ICD-10-CM | POA: Diagnosis present

## 2023-12-08 DIAGNOSIS — M6281 Muscle weakness (generalized): Secondary | ICD-10-CM | POA: Diagnosis present

## 2023-12-08 DIAGNOSIS — R252 Cramp and spasm: Secondary | ICD-10-CM | POA: Diagnosis present

## 2023-12-08 DIAGNOSIS — M5459 Other low back pain: Secondary | ICD-10-CM | POA: Insufficient documentation

## 2023-12-08 NOTE — Therapy (Signed)
 OUTPATIENT PHYSICAL THERAPY THORACOLUMBAR TREATMENT   Patient Name: Tracie Boone MRN: [id number] DOB:Oct 22, 1955, 68 y.o., female Today's Date: 12/08/2023  END OF SESSION:  PT End of Session - 12/08/23 0939     Visit Number 9    Date for PT Re-Evaluation 12/24/23    Authorization Type [employer] Medicare-Auth requested    Authorization Time Period 10/29/2023-12/24/2023    Authorization - Visit Number 8    Authorization - Number of Visits 16    Progress Note Due on Visit 10    PT Start Time 0933    Activity Tolerance Patient tolerated treatment well    Behavior During Therapy [facility] for tasks assessed/performed                 Past Medical History:  Diagnosis Date   Basal cell carcinoma    Hearing disorder    Hypotensive episode    IBS (irritable bowel syndrome)    Rosacea    Sinusitis    Past Surgical History:  Procedure Laterality Date   AUGMENTATION MAMMAPLASTY Bilateral    BREAST EXCISIONAL BIOPSY Left    Patient Active Problem List   Diagnosis Date Noted   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   Herpes labialis 06/26/2019   Menopause present 06/26/2019   Postmenopausal bleeding 06/26/2019   Right foot pain 06/13/2019    PCP: [name], PA-C   REFERRING PROVIDER: [name], PA  REFERRING DIAG:  Free Text Diagnosis  Intervertebral disc disorders w/ radiculopathy, lumbar region    Rationale for Evaluation on and Treatment: Rehabilitation  THERAPY DIAG:  Other low back pain  Muscle weakness (generalized)  Abnormal posture  Difficulty in walking, not elsewhere classified  Cramp and spasm  ONSET DATE: chronic with flare up   SUBJECTIVE:                                                                                                                                                                                           SUBJECTIVE STATEMENT: Patient reports "not doing good today".  I started a new medication for the bone density and I think its  making me stiff.  My back is really hurting too.  The doctor is considering doing ablation.    From MD note on 10/21/23:  Tracie Boone is a pleasant, 68 y.o. female who presents as referral from Dr. Ruthell Cowboy for evaluation and treatment of osteoporosis. Tracie Boone has been having low back pain with radiculopathy and there is consideration for surgery. However, she is not a prime candidate because she has a T-score -3.1 in her lumbar spine from a recent DEXA scan. She notes  history of a right foot stress fracture from no trauma.   PERTINENT HISTORY:  Basal cell carcinoma, osteoporosis (-3.1 T-score)  PAIN: 12/02/23 Are you having pain? Yes: NPRS scale: 4/10 Pain location: bil low back  Pain description: aching Aggravating factors: standing (15-20 min), sitting Relieving factors: Tylenol  PRECAUTIONS: None  RED FLAGS: None   WEIGHT BEARING RESTRICTIONS: No  FALLS:  Has patient fallen in last 6 months? No  LIVING ENVIRONMENT: Lives with: lives with their spouse Lives in: House/apartment   OCCUPATION: retired   PLOF: Independent, Vocation/Vocational requirements: retired, and Leisure: walks 2-3 miles 4-5x/wk   PATIENT GOALS: reduce risk of fracture, reduce LBP  NEXT MD VISIT: April   OBJECTIVE:  Note: Objective measures were completed at Evaluation unless otherwise noted.  DIAGNOSTIC FINDINGS:  See above for t-score information  PATIENT SURVEYS:  10/29/23: Modified Oswestry 20/50=40%   COGNITION: Overall cognitive status: Within functional limits for tasks assessed     SENSATION: WFL  MUSCLE LENGTH: Hamstrings: limited by 25% bil    POSTURE: rounded shoulders, forward head, and flexed trunk   PALPATION: Palpable tenderness over bil lumbar paraspinals and quadratus.  Limited PA mobility with pain T10-L45  LUMBAR ROM:  Sidebending is full, rotation and flexion not tested due to t-score   LOWER EXTREMITY ROM:     Hip flexibility limited by 25% bil  without pain   LOWER EXTREMITY MMT:    4+/5 bil LE and UE strengt     GAIT: Distance walked: 50 Assistive device utilized: None Level of assistance: Complete Independence Comments: flexed trunk and guarded movement   TREATMENT DATE:  12/08/23: Recumbent bike x 5 min level 5 (PT present to discuss status) Standing hamstring stretch 3 x 30 sec at steps Standing quad/hip flexor stretch 3 x 30 sec at steps  Supine PPT x 20 Supine PPT x 20 with marching Supine PPT with dead bug x 20 Supine lower trunk rotation x 20 Supine PPT with clamshell using yellow loop x 20 Side lying clamshell using yellow loop 2 x 10 each side Seated 3 direction physio ball roll outs x 10 each direction with blue ball  12/02/23: Recumbent bike x 5 min level 5 (PT present to discuss status) Standing hamstring stretch 3 x 30 sec at steps Standing quad/hip flexor stretch 3 x 30 sec at steps Seated piriformis stretch 3 x 30 sec  Supine lower trunk rotation x 20 Supine PPT x 20 Supine PPT x 20 with marching Supine SKTC x 5 each LE holding 5 sec Open book x 10 each side Seated 3 direction physio ball roll outs x 10 each direction with blue ball  11/24/23: Nustep x 6 min level 5 (PT present to discuss status) Single leg on airex + shoulder row with red TB x 10 each leg Single leg on airex + shoulder extension  with red TB x 10 each leg Sit to stand  6# + chest press x 10 Sit to stand + over head reach 6# DB x 10 Standing OH press 3# DB 2 x 10 Standing triceps extension 3# DB 2 x 10 Modified deadlift from standard chair with 6# KB 2 x 10  Step ups at stair unilateral 6# DB hold x 20 bilateral  Standing on airex Seated bilateral shoulder horizontal abduction with red tband 2 x 10 Standing on airex Seated bilateral shoulder ER with red tband 2 x 10 Seated chin tucks x 20 Hurdles (forwards/ sideways) x 3 laps Object retrieval (verbal and  visual cues for correct performance)  Leg Press 70# 2 x 10 bilateral ;  40# unilateral x 10 PATIENT EDUCATION:  Education details: Access Code: [email or web address] Person educated: Patient Education method: Explanation, Demonstration, and Handouts Education comprehension: verbalized understanding and returned demonstration  HOME EXERCISE PROGRAM: Access Code: [email or web address] URL: [email or web address] Date: 11/24/2023 Prepared by: Penelope Bowie  Exercises - Seated Scapular Retraction  - 1 x daily - 7 x weekly - 3 sets - 10 reps - Sit to Stand Without Arm Support  - 1-2 x daily - 7 x weekly - 1-2 sets - 10 reps - Seated Hamstring Stretch  - 3 x daily - 7 x weekly - 1 sets - 3 reps - Seated Correct Posture  - 1 x daily - 7 x weekly - 3 sets - 10 reps - Seated Cervical Retraction  - 1 x daily - 7 x weekly - 3 sets - 10 reps - Seated Cervical Rotation AROM  - 1 x daily - 7 x weekly - 3 sets - 10 reps - Seated Cervical Sidebending AROM  - 1 x daily - 7 x weekly - 3 sets - 10 reps - Shoulder extension with resistance - Neutral  - 1 x daily - 7 x weekly - 2 sets - 10 reps - Standing Shoulder Row with Anchored Resistance  - 1 x daily - 7 x weekly - 2 sets - 10 reps - Seated Shoulder Horizontal Abduction with Resistance  - 1 x daily - 7 x weekly - 2 sets - 10 reps - Seated Bilateral Shoulder External Rotation with Resistance  - 1 x daily - 7 x weekly - 2 sets - 10 reps - Quadricep Stretch with Chair and Counter Support  - 1 x daily - 7 x weekly - 1 sets - 3 reps - 30 sec hold - Prone Hip Extension with Pillow Under Abdomen  - 1 x daily - 3 x weekly - 2 sets - 10 reps - Squat with Chest Press  - 1 x daily - 7 x weekly - 1 sets - 10 reps - Seated Overhead Reach Stretch  - 1 x daily - 7 x weekly - 1 sets - 10 reps  ASSESSMENT:  CLINICAL IMPRESSION: Taniyah arrived with less pain today.  She rates this pain at 5/10 upon rising this morning but she took Advil and this came down to 2/10 by the time she arrived for PT.  She requires continued vc's for correct technique on  stretches. She states she is doing her exercises about 4-5 times per week.  She admits she hasn't found a good place to do her quad stretching.  PT suggested stand at kitchen counter/sink and place kitchen chair behind her.  She completed all tasks today without pain.  Her movement pattern is very slow.  Not sure if this is due to her back pain or a neurological condition.  She had a very difficult time rolling side to side on the small table. She continues to be very cautious with any flexion or rotation activities because she was told her bone density is so bad and she doesn't want to fracture anything.  We discussed that normal movement should be ok.  She limits herself to somewhat of an extreme and this may be why she is having such restriction in her lumbar ROM.  She attempted unsupported version of several of the exercises today.  She was able to do several reps but then defaulted to supported.   Patient will  benefit from skilled PT to address the below impairments and improve overall function.    OBJECTIVE IMPAIRMENTS: Abnormal gait, decreased activity tolerance, decreased knowledge of condition, decreased ROM, hypomobility, increased muscle spasms, improper body mechanics, postural dysfunction, and pain.   ACTIVITY LIMITATIONS: carrying, lifting, bending, and locomotion level  PARTICIPATION LIMITATIONS: meal prep, cleaning, laundry, and community activity  PERSONAL FACTORS: Time since onset of injury/illness/exacerbation and 1 comorbidity: osteoporosis  are also affecting patient's functional outcome.   REHAB POTENTIAL: Good  CLINICAL DECISION MAKING: Stable/uncomplicated  EVALUATION COMPLEXITY: Low   GOALS: Goals reviewed with patient? Yes  SHORT TERM GOALS: Target date: 11/26/2023    Be independent in initial HEP Baseline: Goal status: MET 12/02/23  2.  Report > or = to 30% reduction in LBP with standing ADLs and self-care Baseline: 7/10 Goal status: MET 12/08/23  3.  Verbalize  understanding of body mechanics modifications for lumbar protection and to reduce risk of fracture Baseline:  Goal status: MET 12/02/23  4.  Improve Modified Oswestry to > or to 17/50=34% disability  Baseline: 23/50 Goal status: In Progress    LONG TERM GOALS: Target date: 12/24/2023    Be independent in advanced HEP Baseline:  Goal status: INITIAL  2.  Improve Modified Oswestry to < or = to 11/50=22% disability  Baseline: 23/50=46% Goal status: INITIAL  3.  Report > or = to 70% reduction in LBP with standing ADLs and self-care Baseline:  Goal status: INITIAL  4.  Verbalize understanding of dos and don'ts of osteoporosis  Baseline:  Goal status: INITIAL  5.  Stand for 30 min for ADLs and self-care without limitation due to LBP Baseline:  Goal status: INITIAL   PLAN:  PT FREQUENCY: 2x/week  PT DURATION: 8 weeks  PLANNED INTERVENTIONS: 97110-Therapeutic exercises, 97530- Therapeutic activity, [id number]- Neuromuscular re-education, 97535- Self Care, 16109- Manual therapy, [id number]- Gait training, [phone]- Canalith repositioning, [id number]- Aquatic Therapy, U9811- Electrical stimulation (unattended), [phone]- Electrical stimulation (manual), Patient/Family education, Spinal manipulation, Spinal mobilization, Cryotherapy, and Moist heat.  PLAN FOR NEXT SESSION:  Conitnue postural strength &  core strength as tolerated; manual/ DN as indicated  Makeisha Jentsch B. Gaye Scorza, PT 12/08/23 10:16 AM [facility] [address], [address] [place], [place] [address] Phone # [phone] Fax [phone]

## 2023-12-10 ENCOUNTER — Encounter: Admitting: Physical Therapy

## 2023-12-15 ENCOUNTER — Ambulatory Visit

## 2023-12-15 DIAGNOSIS — R262 Difficulty in walking, not elsewhere classified: Secondary | ICD-10-CM

## 2023-12-15 DIAGNOSIS — M5459 Other low back pain: Secondary | ICD-10-CM

## 2023-12-15 DIAGNOSIS — R293 Abnormal posture: Secondary | ICD-10-CM

## 2023-12-15 DIAGNOSIS — R252 Cramp and spasm: Secondary | ICD-10-CM

## 2023-12-15 DIAGNOSIS — M6281 Muscle weakness (generalized): Secondary | ICD-10-CM

## 2023-12-15 NOTE — Therapy (Signed)
 OUTPATIENT PHYSICAL THERAPY THORACOLUMBAR TREATMENT   Patient Name: Tracie Boone MRN: [id number] DOB:June 11, 1956, 68 y.o., female Today's Date: 12/15/2023  END OF SESSION:  PT End of Session - 12/15/23 1024     Visit Number 10    Date for PT Re-Evaluation 12/24/23    Authorization Type [employer] Medicare-Auth requested    Authorization Time Period 10/29/2023-12/24/2023    Authorization - Visit Number 10    Authorization - Number of Visits 16    Progress Note Due on Visit 20    PT Start Time 1015    PT Stop Time 1101    PT Time Calculation (min) 46 min    Activity Tolerance Patient tolerated treatment well    Behavior During Therapy WFL for tasks assessed/performed                 Past Medical History:  Diagnosis Date   Basal cell carcinoma    Hearing disorder    Hypotensive episode    IBS (irritable bowel syndrome)    Rosacea    Sinusitis    Past Surgical History:  Procedure Laterality Date   AUGMENTATION MAMMAPLASTY Bilateral    BREAST EXCISIONAL BIOPSY Left    Patient Active Problem List   Diagnosis Date Noted   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   Herpes labialis 06/26/2019   Menopause present 06/26/2019   Postmenopausal bleeding 06/26/2019   Right foot pain 06/13/2019    PCP: [name], PA-C   REFERRING PROVIDER: [name], PA  REFERRING DIAG:  Free Text Diagnosis  Intervertebral disc disorders w/ radiculopathy, lumbar region    Rationale for Evaluation on and Treatment: Rehabilitation  THERAPY DIAG:  Other low back pain  Muscle weakness (generalized)  Abnormal posture  Difficulty in walking, not elsewhere classified  Cramp and spasm  ONSET DATE: chronic with flare up   SUBJECTIVE:                                                                                                                                                                                           SUBJECTIVE STATEMENT: Patient reports she is feeling a lot better.   She thinks her lethargy was due to the new medicine she is on for bone density (Tymlos).  She sees provider for f/u this week and will discuss with him.  She understands she must get used to the medicine so she will give it some time.    From MD note on 10/21/23:  Tracie Boone is a pleasant, 68 y.o. female who presents as referral from Dr. Ruthell Cowboy for evaluation and treatment of osteoporosis. Dorthe has been having  low back pain with radiculopathy and there is consideration for surgery. However, she is not a prime candidate because she has a T-score -3.1 in her lumbar spine from a recent DEXA scan. She notes history of a right foot stress fracture from no trauma.   PERTINENT HISTORY:  Basal cell carcinoma, osteoporosis (-3.1 T-score)  PAIN: 12/15/23 Are you having pain? Yes: NPRS scale: 4/10 Pain location: bil low back  Pain description: aching Aggravating factors: standing (15-20 min), sitting Relieving factors: Tylenol  PRECAUTIONS: None  RED FLAGS: None   WEIGHT BEARING RESTRICTIONS: No  FALLS:  Has patient fallen in last 6 months? No  LIVING ENVIRONMENT: Lives with: lives with their spouse Lives in: House/apartment   OCCUPATION: retired   PLOF: Independent, Vocation/Vocational requirements: retired, and Leisure: walks 2-3 miles 4-5x/wk   PATIENT GOALS: reduce risk of fracture, reduce LBP  NEXT MD VISIT: April   OBJECTIVE:  Note: Objective measures were completed at Evaluation unless otherwise noted.  DIAGNOSTIC FINDINGS:  See above for t-score information  PATIENT SURVEYS:  10/29/23: Modified Oswestry 20/50=40%   12/15/23: Modified Oswestry 20/50=40%   FUNCTIONAL TESTS: 12/15/23: 5 times sit to stand: 11.25 sec TUG: 9.34 sec  COGNITION: Overall cognitive status: Within functional limits for tasks assessed     SENSATION: WFL  MUSCLE LENGTH: Hamstrings: limited by 25% bil    POSTURE: rounded shoulders, forward head, and flexed trunk    PALPATION: Palpable tenderness over bil lumbar paraspinals and quadratus.  Limited PA mobility with pain T10-L45  LUMBAR ROM:  Sidebending is full, rotation and flexion not tested due to t-score   LOWER EXTREMITY ROM:     Hip flexibility limited by 25% bil without pain   LOWER EXTREMITY MMT:    4+/5 bil LE and UE strengt     GAIT: Distance walked: 50 Assistive device utilized: None Level of assistance: Complete Independence Comments: flexed trunk and guarded movement   TREATMENT DATE:  12/15/23: Nustep x 5 min level 4 (PT present to discuss status) Seated LAQ with 4 lbs 2 x 10 each LE Seated march x 20 with 4 lbs bilaterally Sit to stand x 10  Sit to stand x 10 with 2 lb dumbbells with fwd press  Sit to stand x 10 with 2 lb dumbbells with overhead press Seated punches alternating x 20 Seated steam engines alternating x 20 Re-assessment completed for 10th visit Hip matrix: hip abduction and extension 2 x 10 each on each LE with 25 lbs Standing on balance pad: shoulder extension and rows 2 x 10 with red tband Standing on balance pad: bilateral shoulder ER and horizontal abduction 2 x 10 with red tband Standing hamstring stretch 3 x 30 sec at steps Standing quad/hip flexor stretch 3 x 30 sec at steps  12/08/23: Recumbent bike x 5 min level 5 (PT present to discuss status) Standing hamstring stretch 3 x 30 sec at steps Standing quad/hip flexor stretch 3 x 30 sec at steps  Supine PPT x 20 Supine PPT x 20 with marching Supine PPT with dead bug x 20 Supine lower trunk rotation x 20 Supine PPT with clamshell using yellow loop x 20 Side lying clamshell using yellow loop 2 x 10 each side Seated 3 direction physio ball roll outs x 10 each direction with blue ball  12/02/23: Recumbent bike x 5 min level 5 (PT present to discuss status) Standing hamstring stretch 3 x 30 sec at steps Standing quad/hip flexor stretch 3 x 30 sec at steps  Seated piriformis stretch 3 x 30 sec   Supine lower trunk rotation x 20 Supine PPT x 20 Supine PPT x 20 with marching Supine SKTC x 5 each LE holding 5 sec Open book x 10 each side Seated 3 direction physio ball roll outs x 10 each direction with blue ball  PATIENT EDUCATION:  Education details: Access Code: [email or web address] Person educated: Patient Education method: Explanation, Demonstration, and Handouts Education comprehension: verbalized understanding and returned demonstration  HOME EXERCISE PROGRAM: Access Code: [email or web address] URL: [email or web address] Date: 11/24/2023 Prepared by: Penelope Bowie  Exercises - Seated Scapular Retraction  - 1 x daily - 7 x weekly - 3 sets - 10 reps - Sit to Stand Without Arm Support  - 1-2 x daily - 7 x weekly - 1-2 sets - 10 reps - Seated Hamstring Stretch  - 3 x daily - 7 x weekly - 1 sets - 3 reps - Seated Correct Posture  - 1 x daily - 7 x weekly - 3 sets - 10 reps - Seated Cervical Retraction  - 1 x daily - 7 x weekly - 3 sets - 10 reps - Seated Cervical Rotation AROM  - 1 x daily - 7 x weekly - 3 sets - 10 reps - Seated Cervical Sidebending AROM  - 1 x daily - 7 x weekly - 3 sets - 10 reps - Shoulder extension with resistance - Neutral  - 1 x daily - 7 x weekly - 2 sets - 10 reps - Standing Shoulder Row with Anchored Resistance  - 1 x daily - 7 x weekly - 2 sets - 10 reps - Seated Shoulder Horizontal Abduction with Resistance  - 1 x daily - 7 x weekly - 2 sets - 10 reps - Seated Bilateral Shoulder External Rotation with Resistance  - 1 x daily - 7 x weekly - 2 sets - 10 reps - Quadricep Stretch with Chair and Counter Support  - 1 x daily - 7 x weekly - 1 sets - 3 reps - 30 sec hold - Prone Hip Extension with Pillow Under Abdomen  - 1 x daily - 3 x weekly - 2 sets - 10 reps - Squat with Chest Press  - 1 x daily - 7 x weekly - 1 sets - 10 reps - Seated Overhead Reach Stretch  - 1 x daily - 7 x weekly - 1 sets - 10 reps  ASSESSMENT:  CLINICAL IMPRESSION: Vashon was able to do  much more today.  Balance was good with balance pad activities.  We discussed upcoming end of certification date, but that she has more visits auth'd.  She feels she will be fine with doing the exercises we have given her.  Her ODI did not improve but she did have a few rough days last week.  She feels that since this is most likely due to the medication, she will just continue to do her HEP after her final scheduled visit.   Patient will benefit from skilled PT to address the below impairments and improve overall function.    OBJECTIVE IMPAIRMENTS: Abnormal gait, decreased activity tolerance, decreased knowledge of condition, decreased ROM, hypomobility, increased muscle spasms, improper body mechanics, postural dysfunction, and pain.   ACTIVITY LIMITATIONS: carrying, lifting, bending, and locomotion level  PARTICIPATION LIMITATIONS: meal prep, cleaning, laundry, and community activity  PERSONAL FACTORS: Time since onset of injury/illness/exacerbation and 1 comorbidity: osteoporosis are also affecting patient's functional outcome.   REHAB POTENTIAL: Good  CLINICAL DECISION MAKING: Stable/uncomplicated  EVALUATION COMPLEXITY: Low   GOALS: Goals reviewed with patient? Yes  SHORT TERM GOALS: Target date: 11/26/2023    Be independent in initial HEP Baseline: Goal status: MET 12/02/23  2.  Report > or = to 30% reduction in LBP with standing ADLs and self-care Baseline: 7/10 Goal status: MET 12/08/23  3.  Verbalize understanding of body mechanics modifications for lumbar protection and to reduce risk of fracture Baseline:  Goal status: MET 12/02/23  4.  Improve Modified Oswestry to > or to 17/50=34% disability  Baseline: 23/50 Goal status: MET 12/15/23    LONG TERM GOALS: Target date: 12/24/2023    Be independent in advanced HEP Baseline:  Goal status: MET 12/15/23  2.  Improve Modified Oswestry to < or = to 11/50=22% disability  Baseline: 23/50=46% Goal status: In  progress  3.  Report > or = to 70% reduction in LBP with standing ADLs and self-care Baseline:  Goal status: In progress  4.  Verbalize understanding of dos and don'ts of osteoporosis  Baseline:  Goal status: MET 12/15/23  5.  Stand for 30 min for ADLs and self-care without limitation due to LBP Baseline:  Goal status: In progress   PLAN:  PT FREQUENCY: 2x/week  PT DURATION: 8 weeks  PLANNED INTERVENTIONS: 97110-Therapeutic exercises, 97530- Therapeutic activity, [id number]- Neuromuscular re-education, 97535- Self Care, 16109- Manual therapy, [phone]- Gait training, [phone]- Canalith repositioning, [id number]- Aquatic Therapy, [phone]- Electrical stimulation (unattended), [phone]- Electrical stimulation (manual), Patient/Family education, Spinal manipulation, Spinal mobilization, Cryotherapy, and Moist heat.  PLAN FOR NEXT SESSION:  Conitnue postural strength &  core strength as tolerated; manual/ DN as indicated  Brendi Mccarroll B. Sostenes Kauffmann, PT 12/15/23 11:08 AM [facility] [address], [address] [place], [place] [address] Phone # [phone] Fax [phone]

## 2023-12-17 ENCOUNTER — Encounter: Admitting: Physical Therapy

## 2023-12-22 ENCOUNTER — Ambulatory Visit: Admitting: Physical Therapy

## 2023-12-22 ENCOUNTER — Encounter: Payer: Self-pay | Admitting: Physical Therapy

## 2023-12-22 DIAGNOSIS — M6281 Muscle weakness (generalized): Secondary | ICD-10-CM

## 2023-12-22 DIAGNOSIS — M5459 Other low back pain: Secondary | ICD-10-CM | POA: Diagnosis not present

## 2023-12-22 DIAGNOSIS — R252 Cramp and spasm: Secondary | ICD-10-CM

## 2023-12-22 DIAGNOSIS — R293 Abnormal posture: Secondary | ICD-10-CM

## 2023-12-22 DIAGNOSIS — R262 Difficulty in walking, not elsewhere classified: Secondary | ICD-10-CM

## 2023-12-22 NOTE — Therapy (Signed)
 OUTPATIENT PHYSICAL THERAPY THORACOLUMBAR TREATMENT   Patient Name: Tracie Boone MRN: [id number] DOB:[birth year], 68 y.o., female Today's Date: 12/22/2023  END OF SESSION:  PT End of Session - 12/22/23 1013     Visit Number 11    Date for PT Re-Evaluation 12/24/23    Authorization Type [employer] Medicare-Auth requested    Authorization Time Period 10/29/2023-12/24/2023    Authorization - Visit Number 11    Authorization - Number of Visits 16    Progress Note Due on Visit 20    PT Start Time 0932    PT Stop Time 1011    PT Time Calculation (min) 39 min    Activity Tolerance Patient tolerated treatment well    Behavior During Therapy WFL for tasks assessed/performed                  Past Medical History:  Diagnosis Date   Basal cell carcinoma    Hearing disorder    Hypotensive episode    IBS (irritable bowel syndrome)    Rosacea    Sinusitis    Past Surgical History:  Procedure Laterality Date   AUGMENTATION MAMMAPLASTY Bilateral    BREAST EXCISIONAL BIOPSY Left    Patient Active Problem List   Diagnosis Date Noted   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   Herpes labialis 06/26/2019   Menopause present 06/26/2019   Postmenopausal bleeding 06/26/2019   Right foot pain 06/13/2019    PCP: [name], PA-C   REFERRING PROVIDER: [name], PA  REFERRING DIAG:  Free Text Diagnosis  Intervertebral disc disorders w/ radiculopathy, lumbar region    Rationale for Evaluation on and Treatment: Rehabilitation  THERAPY DIAG:  Other low back pain  Muscle weakness (generalized)  Abnormal posture  Difficulty in walking, not elsewhere classified  Cramp and spasm  ONSET DATE: chronic with flare up   SUBJECTIVE:                                                                                                                                                                                           SUBJECTIVE STATEMENT: Patient report she is doing good today.  Pain is 1-2/10. She feels more tired than anything.    From MD note on 10/21/23:  Tracie Boone is a pleasant, 68 y.o. female who presents as referral from Dr. Ruthell Cowboy for evaluation and treatment of osteoporosis. [name] has been having low back pain with radiculopathy and there is consideration for surgery. However, she is not a prime candidate because she has a T-score -3.1 in her lumbar spine from a recent DEXA scan. She notes history of a right  foot stress fracture from no trauma.   PERTINENT HISTORY:  Basal cell carcinoma, osteoporosis (-3.1 T-score)  PAIN: 12/15/23 Are you having pain? Yes: NPRS scale: 4/10 Pain location: bil low back  Pain description: aching Aggravating factors: standing (15-20 min), sitting Relieving factors: Tylenol  PRECAUTIONS: None  RED FLAGS: None   WEIGHT BEARING RESTRICTIONS: No  FALLS:  Has patient fallen in last 6 months? No  LIVING ENVIRONMENT: Lives with: lives with their spouse Lives in: House/apartment   OCCUPATION: retired   PLOF: Independent, Vocation/Vocational requirements: retired, and Leisure: walks 2-3 miles 4-5x/wk   PATIENT GOALS: reduce risk of fracture, reduce LBP  NEXT MD VISIT: April   OBJECTIVE:  Note: Objective measures were completed at Evaluation unless otherwise noted.  DIAGNOSTIC FINDINGS:  See above for t-score information  PATIENT SURVEYS:  10/29/23: Modified Oswestry 20/50=40%   12/15/23: Modified Oswestry 20/50=40%   FUNCTIONAL TESTS: 12/15/23: 5 times sit to stand: 11.25 sec TUG: 9.34 sec  COGNITION: Overall cognitive status: Within functional limits for tasks assessed     SENSATION: WFL  MUSCLE LENGTH: Hamstrings: limited by 25% bil    POSTURE: rounded shoulders, forward head, and flexed trunk   PALPATION: Palpable tenderness over bil lumbar paraspinals and quadratus.  Limited PA mobility with pain T10-L45  LUMBAR ROM:  Sidebending is full, rotation and flexion not tested  due to t-score   LOWER EXTREMITY ROM:     Hip flexibility limited by 25% bil without pain   LOWER EXTREMITY MMT:    4+/5 bil LE and UE strengt     GAIT: Distance walked: 50 Assistive device utilized: None Level of assistance: Complete Independence Comments: flexed trunk and guarded movement   TREATMENT DATE:  12/22/23: Nustep x 5 min level 4 (PT present to discuss status) Seated LAQ with 4 lbs 2 x 10 each LE Standing march x 20 with 4 lbs bilaterally Standing hip abduction & extension  4# AW x 10 bilateral  Sit to stand x 10  Sit to stand x 10 with 2 lb dumbbells with fwd press  Sit to stand x 10 with 2 lb dumbbells with overhead press Seated punches alternating x 20 2# DB Seated steam engines alternating x 20 Hip matrix: hip abduction and extension  x 10 each on each LE with 25 lbs Standing on balance pad: shoulder extension and rows 2 x 10 with red tband Standing on balance pad: bilateral shoulder ER and horizontal abduction  x 10 with red tband Airex step ups + knee drive x 10 each leg Hurdles (forwards step over pattern) & sideways no UE support x 2 laps     12/15/23: Nustep x 5 min level 4 (PT present to discuss status) Seated LAQ with 4 lbs 2 x 10 each LE Seated march x 20 with 4 lbs bilaterally Sit to stand x 10  Sit to stand x 10 with 2 lb dumbbells with fwd press  Sit to stand x 10 with 2 lb dumbbells with overhead press Seated punches alternating x 20 Seated steam engines alternating x 20 Re-assessment completed for 10th visit Hip matrix: hip abduction and extension 2 x 10 each on each LE with 25 lbs Standing on balance pad: shoulder extension and rows 2 x 10 with red tband Standing on balance pad: bilateral shoulder ER and horizontal abduction 2 x 10 with red tband Standing hamstring stretch 3 x 30 sec at steps Standing quad/hip flexor stretch 3 x 30 sec at steps  12/08/23: Recumbent bike  x 5 min level 5 (PT present to discuss status) Standing hamstring  stretch 3 x 30 sec at steps Standing quad/hip flexor stretch 3 x 30 sec at steps  Supine PPT x 20 Supine PPT x 20 with marching Supine PPT with dead bug x 20 Supine lower trunk rotation x 20 Supine PPT with clamshell using yellow loop x 20 Side lying clamshell using yellow loop 2 x 10 each side Seated 3 direction physio ball roll outs x 10 each direction with blue ball   PATIENT EDUCATION:  Education details: Access Code: [email or web address] Person educated: Patient Education method: Explanation, Demonstration, and Handouts Education comprehension: verbalized understanding and returned demonstration  HOME EXERCISE PROGRAM: Access Code: [email or web address] URL: [email or web address] Date: 11/24/2023 Prepared by: Penelope Bowie  Exercises - Seated Scapular Retraction  - 1 x daily - 7 x weekly - 3 sets - 10 reps - Sit to Stand Without Arm Support  - 1-2 x daily - 7 x weekly - 1-2 sets - 10 reps - Seated Hamstring Stretch  - 3 x daily - 7 x weekly - 1 sets - 3 reps - Seated Correct Posture  - 1 x daily - 7 x weekly - 3 sets - 10 reps - Seated Cervical Retraction  - 1 x daily - 7 x weekly - 3 sets - 10 reps - Seated Cervical Rotation AROM  - 1 x daily - 7 x weekly - 3 sets - 10 reps - Seated Cervical Sidebending AROM  - 1 x daily - 7 x weekly - 3 sets - 10 reps - Shoulder extension with resistance - Neutral  - 1 x daily - 7 x weekly - 2 sets - 10 reps - Standing Shoulder Row with Anchored Resistance  - 1 x daily - 7 x weekly - 2 sets - 10 reps - Seated Shoulder Horizontal Abduction with Resistance  - 1 x daily - 7 x weekly - 2 sets - 10 reps - Seated Bilateral Shoulder External Rotation with Resistance  - 1 x daily - 7 x weekly - 2 sets - 10 reps - Quadricep Stretch with Chair and Counter Support  - 1 x daily - 7 x weekly - 1 sets - 3 reps - 30 sec hold - Prone Hip Extension with Pillow Under Abdomen  - 1 x daily - 3 x weekly - 2 sets - 10 reps - Squat with Chest Press  - 1 x daily - 7 x weekly  - 1 sets - 10 reps - Seated Overhead Reach Stretch  - 1 x daily - 7 x weekly - 1 sets - 10 reps  ASSESSMENT:  CLINICAL IMPRESSION: Calissa presents to therapy with minimal pain and discomfort. She tolerated exercises well and did not verbalize any increased pain or discomfort. When performing hurdles patient caught foot on hurdle but she was able to self recover balance. She verbalized fatigue at the end of session that she attributed to her lack of sleep. Patient to discharge next session.    OBJECTIVE IMPAIRMENTS: Abnormal gait, decreased activity tolerance, decreased knowledge of condition, decreased ROM, hypomobility, increased muscle spasms, improper body mechanics, postural dysfunction, and pain.   ACTIVITY LIMITATIONS: carrying, lifting, bending, and locomotion level  PARTICIPATION LIMITATIONS: meal prep, cleaning, laundry, and community activity  PERSONAL FACTORS: Time since onset of injury/illness/exacerbation and 1 comorbidity: osteoporosis are also affecting patient's functional outcome.   REHAB POTENTIAL: Good  CLINICAL DECISION MAKING: Stable/uncomplicated  EVALUATION COMPLEXITY: Low   GOALS: Goals reviewed with patient? Yes  SHORT TERM GOALS: Target date: 11/26/2023    Be independent in initial HEP Baseline: Goal status: MET 12/02/23  2.  Report > or = to 30% reduction in LBP with standing ADLs and self-care Baseline: 7/10 Goal status: MET 12/08/23  3.  Verbalize understanding of body mechanics modifications for lumbar protection and to reduce risk of fracture Baseline:  Goal status: MET 12/02/23  4.  Improve Modified Oswestry to > or to 17/50=34% disability  Baseline: 23/50 Goal status: MET 12/15/23    LONG TERM GOALS: Target date: 12/24/2023    Be independent in advanced HEP Baseline:  Goal status: MET 12/15/23  2.  Improve Modified Oswestry to < or = to 11/50=22% disability  Baseline: 23/50=46% Goal status: In progress  3.  Report > or = to 70%  reduction in LBP with standing ADLs and self-care Baseline:  Goal status: In progress  4.  Verbalize understanding of dos and don'ts of osteoporosis  Baseline:  Goal status: MET 12/15/23  5.  Stand for 30 min for ADLs and self-care without limitation due to LBP Baseline:  Goal status: In progress   PLAN:  PT FREQUENCY: 2x/week  PT DURATION: 8 weeks  PLANNED INTERVENTIONS: 97110-Therapeutic exercises, 97530- Therapeutic activity, [id number]- Neuromuscular re-education, 97535- Self Care, 11914- Manual therapy, [phone]- Gait training, [phone]- Canalith repositioning, [id number]- Aquatic Therapy, [phone]- Electrical stimulation (unattended), [phone]- Electrical stimulation (manual), Patient/Family education, Spinal manipulation, Spinal mobilization, Cryotherapy, and Moist heat.  PLAN FOR NEXT SESSION:  plan to discharge next treatment session  Penelope Bowie, PT 12/22/23 10:14 AM [facility] [address], [address] [place], [place] [address] Phone # [phone] Fax [phone]

## 2023-12-24 ENCOUNTER — Ambulatory Visit

## 2023-12-24 DIAGNOSIS — M5459 Other low back pain: Secondary | ICD-10-CM | POA: Diagnosis not present

## 2023-12-24 DIAGNOSIS — R262 Difficulty in walking, not elsewhere classified: Secondary | ICD-10-CM

## 2023-12-24 DIAGNOSIS — R293 Abnormal posture: Secondary | ICD-10-CM

## 2023-12-24 DIAGNOSIS — M6281 Muscle weakness (generalized): Secondary | ICD-10-CM

## 2023-12-24 DIAGNOSIS — R252 Cramp and spasm: Secondary | ICD-10-CM

## 2023-12-24 NOTE — Therapy (Signed)
 OUTPATIENT PHYSICAL THERAPY THORACOLUMBAR TREATMENT   Patient Name: Tracie Boone MRN: [id number] DOB:[birth year], 68 y.o., female Today's Date: 12/24/2023  END OF SESSION:  PT End of Session - 12/24/23 0936     Visit Number 12    Date for PT Re-Evaluation 12/24/23    Authorization Type [employer] Medicare-Auth requested    Authorization Time Period 10/29/2023-12/24/2023    Authorization - Visit Number 12    Authorization - Number of Visits 16    Progress Note Due on Visit 20    PT Start Time 0930    PT Stop Time 1000    PT Time Calculation (min) 30 min    Activity Tolerance Patient tolerated treatment well    Behavior During Therapy WFL for tasks assessed/performed                  Past Medical History:  Diagnosis Date   Basal cell carcinoma    Hearing disorder    Hypotensive episode    IBS (irritable bowel syndrome)    Rosacea    Sinusitis    Past Surgical History:  Procedure Laterality Date   AUGMENTATION MAMMAPLASTY Bilateral    BREAST EXCISIONAL BIOPSY Left    Patient Active Problem List   Diagnosis Date Noted   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   Herpes labialis 06/26/2019   Menopause present 06/26/2019   Postmenopausal bleeding 06/26/2019   Right foot pain 06/13/2019    PCP: [name], PA-C   REFERRING PROVIDER: [name], PA  REFERRING DIAG:  Free Text Diagnosis  Intervertebral disc disorders w/ radiculopathy, lumbar region    Rationale for Evaluation on and Treatment: Rehabilitation  THERAPY DIAG:  Other low back pain  Muscle weakness (generalized)  Abnormal posture  Cramp and spasm  Difficulty in walking, not elsewhere classified  ONSET DATE: chronic with flare up   SUBJECTIVE:                                                                                                                                                                                           SUBJECTIVE STATEMENT: Patient reports she is ready for DC.  "I  think I can follow through with the exercises at home".  I have a little pain today but it was because I scrambled around to clean the house this morning.      From MD note on 10/21/23:  Tracie Boone is a pleasant, 68 y.o. female who presents as referral from Dr. Ruthell Cowboy for evaluation and treatment of osteoporosis. [name] has been having low back pain with radiculopathy and there is consideration for surgery. However, she is  not a prime candidate because she has a T-score -3.1 in her lumbar spine from a recent DEXA scan. She notes history of a right foot stress fracture from no trauma.   PERTINENT HISTORY:  Basal cell carcinoma, osteoporosis (-3.1 T-score)  PAIN: 12/24/23 Are you having pain? Yes: NPRS scale: 3-4/10 first thing this morning but ok now Pain location: bil low back  Pain description: aching Aggravating factors: standing (15-20 min), sitting Relieving factors: Tylenol  PRECAUTIONS: None  RED FLAGS: None   WEIGHT BEARING RESTRICTIONS: No  FALLS:  Has patient fallen in last 6 months? No  LIVING ENVIRONMENT: Lives with: lives with their spouse Lives in: House/apartment   OCCUPATION: retired   PLOF: Independent, Vocation/Vocational requirements: retired, and Leisure: walks 2-3 miles 4-5x/wk   PATIENT GOALS: reduce risk of fracture, reduce LBP  NEXT MD VISIT: April   OBJECTIVE:  Note: Objective measures were completed at Evaluation unless otherwise noted.  DIAGNOSTIC FINDINGS:  See above for t-score information  PATIENT SURVEYS:  10/29/23: Modified Oswestry 20/50=40%   12/15/23: Modified Oswestry 20/50=40%   12/24/23: Modified Oswestry 18/50=36%   FUNCTIONAL TESTS: 12/15/23: 5 times sit to stand: 11.25 sec TUG: 9.34 sec  12/24/23: 5 times sit to stand: 8.58 sec TUG: 8.69 sec  COGNITION: Overall cognitive status: Within functional limits for tasks assessed     SENSATION: WFL  MUSCLE LENGTH: Hamstrings: limited by 25% bil     POSTURE: rounded shoulders, forward head, and flexed trunk   PALPATION: Palpable tenderness over bil lumbar paraspinals and quadratus.  Limited PA mobility with pain T10-L45  LUMBAR ROM:  Sidebending is full, rotation and flexion not tested due to t-score   LOWER EXTREMITY ROM:     Hip flexibility limited by 25% bil without pain   LOWER EXTREMITY MMT:    4+/5 bil LE and UE strengt     GAIT: Distance walked: 50 Assistive device utilized: None Level of assistance: Complete Independence Comments: flexed trunk and guarded movement   TREATMENT DATE:  12/22/23: Nustep x 5 min level 4 (PT present to discuss status) DC assessment completed Reviewed all of HEP and provided DC plan  12/22/23: Nustep x 5 min level 4 (PT present to discuss status) Seated LAQ with 4 lbs 2 x 10 each LE Standing march x 20 with 4 lbs bilaterally Standing hip abduction & extension  4# AW x 10 bilateral  Sit to stand x 10  Sit to stand x 10 with 2 lb dumbbells with fwd press  Sit to stand x 10 with 2 lb dumbbells with overhead press Seated punches alternating x 20 2# DB Seated steam engines alternating x 20 Hip matrix: hip abduction and extension  x 10 each on each LE with 25 lbs Standing on balance pad: shoulder extension and rows 2 x 10 with red tband Standing on balance pad: bilateral shoulder ER and horizontal abduction  x 10 with red tband Airex step ups + knee drive x 10 each leg Hurdles (forwards step over pattern) & sideways no UE support x 2 laps  12/15/23: Nustep x 5 min level 4 (PT present to discuss status) Seated LAQ with 4 lbs 2 x 10 each LE Seated march x 20 with 4 lbs bilaterally Sit to stand x 10  Sit to stand x 10 with 2 lb dumbbells with fwd press  Sit to stand x 10 with 2 lb dumbbells with overhead press Seated punches alternating x 20 Seated steam engines alternating x 20 Re-assessment completed for  10th visit Hip matrix: hip abduction and extension 2 x 10 each on each  LE with 25 lbs Standing on balance pad: shoulder extension and rows 2 x 10 with red tband Standing on balance pad: bilateral shoulder ER and horizontal abduction 2 x 10 with red tband Standing hamstring stretch 3 x 30 sec at steps Standing quad/hip flexor stretch 3 x 30 sec at steps  PATIENT EDUCATION:  Education details: Access Code: [email or web address] Person educated: Patient Education method: Explanation, Demonstration, and Handouts Education comprehension: verbalized understanding and returned demonstration  HOME EXERCISE PROGRAM: Access Code: [email or web address] URL: [email or web address] Date: 11/24/2023 Prepared by: Penelope Bowie  Exercises - Seated Scapular Retraction  - 1 x daily - 7 x weekly - 3 sets - 10 reps - Sit to Stand Without Arm Support  - 1-2 x daily - 7 x weekly - 1-2 sets - 10 reps - Seated Hamstring Stretch  - 3 x daily - 7 x weekly - 1 sets - 3 reps - Seated Correct Posture  - 1 x daily - 7 x weekly - 3 sets - 10 reps - Seated Cervical Retraction  - 1 x daily - 7 x weekly - 3 sets - 10 reps - Seated Cervical Rotation AROM  - 1 x daily - 7 x weekly - 3 sets - 10 reps - Seated Cervical Sidebending AROM  - 1 x daily - 7 x weekly - 3 sets - 10 reps - Shoulder extension with resistance - Neutral  - 1 x daily - 7 x weekly - 2 sets - 10 reps - Standing Shoulder Row with Anchored Resistance  - 1 x daily - 7 x weekly - 2 sets - 10 reps - Seated Shoulder Horizontal Abduction with Resistance  - 1 x daily - 7 x weekly - 2 sets - 10 reps - Seated Bilateral Shoulder External Rotation with Resistance  - 1 x daily - 7 x weekly - 2 sets - 10 reps - Quadricep Stretch with Chair and Counter Support  - 1 x daily - 7 x weekly - 1 sets - 3 reps - 30 sec hold - Prone Hip Extension with Pillow Under Abdomen  - 1 x daily - 3 x weekly - 2 sets - 10 reps - Squat with Chest Press  - 1 x daily - 7 x weekly - 1 sets - 10 reps - Seated Overhead Reach Stretch  - 1 x daily - 7 x weekly - 1 sets - 10  reps  ASSESSMENT:  CLINICAL IMPRESSION: Sakia did not meet all goals but is compliant and well motivated to continue her HEP.  She is functioning at a much higher level with minimal pain.  She is back to doing her household chores which do still seem to make her back a little sore but she is able to stretch and relieve this with ease.  She should continue to do well.  We will DC at this time.    OBJECTIVE IMPAIRMENTS: Abnormal gait, decreased activity tolerance, decreased knowledge of condition, decreased ROM, hypomobility, increased muscle spasms, improper body mechanics, postural dysfunction, and pain.   ACTIVITY LIMITATIONS: carrying, lifting, bending, and locomotion level  PARTICIPATION LIMITATIONS: meal prep, cleaning, laundry, and community activity  PERSONAL FACTORS: Time since onset of injury/illness/exacerbation and 1 comorbidity: osteoporosis are also affecting patient's functional outcome.   REHAB POTENTIAL: Good  CLINICAL DECISION MAKING: Stable/uncomplicated  EVALUATION COMPLEXITY: Low   GOALS: Goals reviewed with patient? Yes  SHORT TERM GOALS: Target date: 11/26/2023  Be independent in initial HEP Baseline: Goal status: MET 12/02/23  2.  Report > or = to 30% reduction in LBP with standing ADLs and self-care Baseline: 7/10 Goal status: MET 12/08/23  3.  Verbalize understanding of body mechanics modifications for lumbar protection and to reduce risk of fracture Baseline:  Goal status: MET 12/02/23  4.  Improve Modified Oswestry to > or to 17/50=34% disability  Baseline: 23/50 Goal status: MET 12/15/23    LONG TERM GOALS: Target date: 12/24/2023    Be independent in advanced HEP Baseline:  Goal status: MET 12/15/23  2.  Improve Modified Oswestry to < or = to 11/50=22% disability  Baseline: 23/50=46% Goal status: Partially met (36%)  3.  Report > or = to 70% reduction in LBP with standing ADLs and self-care Baseline:  Goal status: Partially met (patient  reports 40%)  4.  Verbalize understanding of dos and don'ts of osteoporosis  Baseline:  Goal status: MET 12/15/23  5.  Stand for 30 min for ADLs and self-care without limitation due to LBP Baseline:  Goal status: MET 12/24/23   PLAN:  PT FREQUENCY: 2x/week  PT DURATION: 8 weeks  PLANNED INTERVENTIONS: 97110-Therapeutic exercises, 97530- Therapeutic activity, 97112- Neuromuscular re-education, 97535- Self Care, 29562- Manual therapy, [id number]- Gait training, [phone]- Canalith repositioning, [id number]- Aquatic Therapy, [id number]- Electrical stimulation (unattended), [phone]- Electrical stimulation (manual), Patient/Family education, Spinal manipulation, Spinal mobilization, Cryotherapy, and Moist heat.  PLAN FOR NEXT SESSION:  DC to HEP  Rayford Williamsen B. Numa Schroeter, PT 12/24/23 10:13 AM [facility] [address], [address] [place], [place] [address] Phone # [phone] Fax [phone]
